# Patient Record
Sex: Female | Born: 1975
Health system: Southern US, Community
[De-identification: ages and names within clinical notes are randomized; demographics above are authoritative.]

## PROBLEM LIST (undated history)

## (undated) DIAGNOSIS — M419 Scoliosis, unspecified: Secondary | ICD-10-CM

## (undated) DIAGNOSIS — K219 Gastro-esophageal reflux disease without esophagitis: Secondary | ICD-10-CM

## (undated) HISTORY — PX: APPENDECTOMY: SHX54

---

## 2019-02-10 ENCOUNTER — Other Ambulatory Visit: Payer: Self-pay | Admitting: Obstetrics and Gynecology

## 2019-04-08 NOTE — H&P (Signed)
Mackenzie Delacruz is a 43 y.o. 43 YO female, P: 1-0-2-1 presents for tubal sterilization and endometrial ablation due to her desire to cease childbearing and dysfunctional uterine bleeding. The patient typically would have a menstrual flow that lasted for 5 days with a pad change 3 times a day. with cramping that was relieved with Tylenol.  At the end of July, however , she began to bleed daily for 1.5 months with the need to change a pad twice daily and minimal cramping. A pelvic ultrasound  01/04/2019 showed a  retroverted uterus with appearance of adenomyosis (measures 8.8 cm from fundus to external os) 4.12 x 5.32 x 5.39 cm; endometrium: 11.86 mm; right ovary-3.16 cm and left ovary-2.70 cm.  Subsequently she was given a 10 day course of Provera 10 mg and her bleeding stopped.  She denies any post coital bleeding, dyspareunia, vaginitis symptoms or changes in bowel or bladder function.  An endometrial biopsy in August 2020 returned benign secretory endometrium with no malignancy or hyperplasia.  A review of both medical and surgical options for management of contraception and dysfunctional uterine bleeding were given to the patient however,  she has decided to proceed with surgical management.   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  Allergies: Sulfa-Hives  Denies sensitivity to peanuts, shellfish, soy, latex or  adhesives.    Medications: Multivitamins daily Nexium (OTC) prn  ROS: Admits to glasses, occasional left knee pain, problems swallowing with acid reflux flare and occasional constipation.   Denies headache, vision changes, nasal congestion,  tinnitus, dizziness, hoarseness, cough,  chest pain, shortness of breath, nausea, vomiting, diarrhea,  urinary frequency, urgency  dysuria, hematuria, vaginitis symptoms, pelvic pain, swelling of joints,easy bruising,  myalgias,  skin rashes, unexplained weight loss and except as is mentioned in the history of present illness, patient's review of systems is otherwise negative.     Physical Exam  Bp: 92/60  P: 74 bpm  R: 16  Temperature: 97.7 degrees F (temporally)     Weight: 111 lbs.  Height: 5 '  BMI: 21.7  Neck: supple without masses or thyromegaly Lungs: clear to auscultation Heart: regular rate and rhythm Abdomen: soft, non-tender and no organomegaly Pelvic:EGBUS- wnl; vagina-normal rugae; uterus-normal size, cervix without lesions or motion tenderness; adnexae-no tenderness or masses Extremities:  no clubbing, cyanosis or edema   Assesment: Dysfunctional Uterine Bleeding                                                     Desire for Sterilization                      Adenomyosis  Disposition:  A discussion was held with patient regarding the indication for her procedure(s) along with the risks, which include but are not limited to: reaction  to anesthesia, damage to adjacent organs, infection,  excessive bleeding and continued menstrual flow. .  The patient verbalized understanding of these risks and has consented to proceed with Tubal Sterilization and Hydrothermal Endometrial Ablation at [facility] on April 14, 2019 @ 1: 30 p.m.   CSN# [id number]   Layza Summa J. Lowell Guitar, PA-C  for Dr. Pierre Bali. Dillard

## 2019-04-10 ENCOUNTER — Other Ambulatory Visit (HOSPITAL_COMMUNITY)
Admission: RE | Admit: 2019-04-10 | Discharge: 2019-04-10 | Disposition: A | Discharge status: Home / Self Care | Payer: BC Managed Care – PPO | Source: Ambulatory Visit | Attending: Obstetrics and Gynecology | Admitting: Obstetrics and Gynecology

## 2019-04-10 DIAGNOSIS — Z20828 Contact with and (suspected) exposure to other viral communicable diseases: Secondary | ICD-10-CM | POA: Diagnosis not present

## 2019-04-10 DIAGNOSIS — Z01812 Encounter for preprocedural laboratory examination: Secondary | ICD-10-CM | POA: Diagnosis present

## 2019-04-11 LAB — NOVEL CORONAVIRUS, NAA (HOSP ORDER, SEND-OUT TO REF LAB; TAT 18-24 HRS): SARS-CoV-2, NAA: NOT DETECTED

## 2019-04-13 ENCOUNTER — Other Ambulatory Visit: Payer: Self-pay

## 2019-04-13 ENCOUNTER — Encounter (HOSPITAL_BASED_OUTPATIENT_CLINIC_OR_DEPARTMENT_OTHER): Payer: Self-pay | Admitting: *Deleted

## 2019-04-13 NOTE — Progress Notes (Signed)
Spoke with patient via telephone for pre op interview. NPO after MN. Patient to take Nexium with a sip of water AM of surgery. Will need UPT, CBC AM of surgery. arrival time 1130.

## 2019-04-14 ENCOUNTER — Ambulatory Visit (HOSPITAL_BASED_OUTPATIENT_CLINIC_OR_DEPARTMENT_OTHER): Payer: BC Managed Care – PPO | Admitting: Anesthesiology

## 2019-04-14 ENCOUNTER — Ambulatory Visit (HOSPITAL_BASED_OUTPATIENT_CLINIC_OR_DEPARTMENT_OTHER)
Admission: RE | Admit: 2019-04-14 | Discharge: 2019-04-14 | Disposition: A | Discharge status: Home / Self Care | Payer: BC Managed Care – PPO | Attending: Obstetrics and Gynecology | Admitting: Obstetrics and Gynecology

## 2019-04-14 ENCOUNTER — Encounter (HOSPITAL_BASED_OUTPATIENT_CLINIC_OR_DEPARTMENT_OTHER)
Admission: RE | Disposition: A | Discharge status: Home / Self Care | Payer: Self-pay | Source: Home / Self Care | Attending: Obstetrics and Gynecology

## 2019-04-14 ENCOUNTER — Encounter (HOSPITAL_BASED_OUTPATIENT_CLINIC_OR_DEPARTMENT_OTHER): Payer: Self-pay | Admitting: *Deleted

## 2019-04-14 DIAGNOSIS — Z882 Allergy status to sulfonamides status: Secondary | ICD-10-CM | POA: Insufficient documentation

## 2019-04-14 DIAGNOSIS — Z87891 Personal history of nicotine dependence: Secondary | ICD-10-CM | POA: Insufficient documentation

## 2019-04-14 DIAGNOSIS — N939 Abnormal uterine and vaginal bleeding, unspecified: Secondary | ICD-10-CM | POA: Insufficient documentation

## 2019-04-14 DIAGNOSIS — Z833 Family history of diabetes mellitus: Secondary | ICD-10-CM | POA: Insufficient documentation

## 2019-04-14 DIAGNOSIS — N920 Excessive and frequent menstruation with regular cycle: Secondary | ICD-10-CM

## 2019-04-14 DIAGNOSIS — Z823 Family history of stroke: Secondary | ICD-10-CM | POA: Diagnosis not present

## 2019-04-14 DIAGNOSIS — K219 Gastro-esophageal reflux disease without esophagitis: Secondary | ICD-10-CM | POA: Diagnosis not present

## 2019-04-14 DIAGNOSIS — Z803 Family history of malignant neoplasm of breast: Secondary | ICD-10-CM | POA: Insufficient documentation

## 2019-04-14 DIAGNOSIS — Z8249 Family history of ischemic heart disease and other diseases of the circulatory system: Secondary | ICD-10-CM | POA: Diagnosis not present

## 2019-04-14 DIAGNOSIS — Z8049 Family history of malignant neoplasm of other genital organs: Secondary | ICD-10-CM | POA: Diagnosis not present

## 2019-04-14 DIAGNOSIS — Z302 Encounter for sterilization: Secondary | ICD-10-CM | POA: Diagnosis not present

## 2019-04-14 DIAGNOSIS — Z79899 Other long term (current) drug therapy: Secondary | ICD-10-CM | POA: Insufficient documentation

## 2019-04-14 HISTORY — PX: HYSTEROSCOPY: SHX211

## 2019-04-14 HISTORY — PX: LAPAROSCOPIC BILATERAL SALPINGECTOMY: SHX5889

## 2019-04-14 HISTORY — DX: Gastro-esophageal reflux disease without esophagitis: K21.9

## 2019-04-14 LAB — CBC
HCT: 38.7 % (ref 36.0–46.0)
Hemoglobin: 13 g/dL (ref 12.0–15.0)
MCH: 30.4 pg (ref 26.0–34.0)
MCHC: 33.6 g/dL (ref 30.0–36.0)
MCV: 90.4 fL (ref 80.0–100.0)
Platelets: 202 10*3/uL (ref 150–400)
RBC: 4.28 MIL/uL (ref 3.87–5.11)
RDW: 12.5 % (ref 11.5–15.5)
WBC: 6.6 10*3/uL (ref 4.0–10.5)
nRBC: 0 % (ref 0.0–0.2)

## 2019-04-14 LAB — POCT PREGNANCY, URINE: Preg Test, Ur: NEGATIVE

## 2019-04-14 SURGERY — SALPINGECTOMY, BILATERAL, LAPAROSCOPIC
Anesthesia: General | Site: Vagina

## 2019-04-14 MED ORDER — HYDROCODONE-ACETAMINOPHEN 5-325 MG PO TABS: ORAL_TABLET | ORAL | 0 refills | Status: DC

## 2019-04-14 MED ORDER — IBUPROFEN 600 MG PO TABS: ORAL_TABLET | ORAL | 1 refills | Status: DC

## 2019-04-14 MED ORDER — ONDANSETRON HCL 4 MG/2ML IJ SOLN
INTRAMUSCULAR | Status: AC
  Filled 2019-04-14: qty 2

## 2019-04-14 MED ORDER — LIDOCAINE HCL 2 % IJ SOLN
INTRAMUSCULAR | Status: DC | PRN
  Administered 2019-04-14: 10 mL

## 2019-04-14 MED ORDER — FENTANYL CITRATE (PF) 100 MCG/2ML IJ SOLN
INTRAMUSCULAR | Status: AC
  Filled 2019-04-14: qty 2

## 2019-04-14 MED ORDER — KETOROLAC TROMETHAMINE 30 MG/ML IJ SOLN
INTRAMUSCULAR | Status: DC | PRN
  Administered 2019-04-14: 30 mg via INTRAVENOUS

## 2019-04-14 MED ORDER — ROCURONIUM BROMIDE 50 MG/5ML IV SOSY
PREFILLED_SYRINGE | INTRAVENOUS | Status: DC | PRN
  Administered 2019-04-14: 30 mg via INTRAVENOUS
  Administered 2019-04-14: 10 mg via INTRAVENOUS

## 2019-04-14 MED ORDER — FAMOTIDINE 20 MG PO TABS
20.0000 mg | ORAL_TABLET | Freq: Two times a day (BID) | ORAL | Status: DC
  Administered 2019-04-14: 20 mg via ORAL
  Filled 2019-04-14: qty 1

## 2019-04-14 MED ORDER — MIDAZOLAM HCL 2 MG/2ML IJ SOLN
INTRAMUSCULAR | Status: AC
  Filled 2019-04-14: qty 2

## 2019-04-14 MED ORDER — ACETAMINOPHEN 160 MG/5ML PO SOLN
325.0000 mg | ORAL | Status: DC | PRN
  Filled 2019-04-14: qty 20.3

## 2019-04-14 MED ORDER — ONDANSETRON HCL 4 MG/2ML IJ SOLN
INTRAMUSCULAR | Status: DC | PRN
  Administered 2019-04-14: 4 mg via INTRAVENOUS

## 2019-04-14 MED ORDER — FENTANYL CITRATE (PF) 100 MCG/2ML IJ SOLN
25.0000 ug | INTRAMUSCULAR | Status: DC | PRN
  Filled 2019-04-14: qty 1

## 2019-04-14 MED ORDER — OXYCODONE HCL 5 MG PO TABS
5.0000 mg | ORAL_TABLET | Freq: Once | ORAL | Status: DC | PRN
  Filled 2019-04-14: qty 1

## 2019-04-14 MED ORDER — LIDOCAINE 2% (20 MG/ML) 5 ML SYRINGE
INTRAMUSCULAR | Status: AC
  Filled 2019-04-14: qty 5

## 2019-04-14 MED ORDER — MEPERIDINE HCL 25 MG/ML IJ SOLN
6.2500 mg | INTRAMUSCULAR | Status: DC | PRN
  Administered 2019-04-14: 6.25 mg via INTRAVENOUS
  Filled 2019-04-14: qty 1

## 2019-04-14 MED ORDER — KETOROLAC TROMETHAMINE 30 MG/ML IJ SOLN
INTRAMUSCULAR | Status: AC
  Filled 2019-04-14: qty 1

## 2019-04-14 MED ORDER — OXYCODONE HCL 5 MG/5ML PO SOLN
5.0000 mg | Freq: Once | ORAL | Status: DC | PRN
  Filled 2019-04-14: qty 5

## 2019-04-14 MED ORDER — PROPOFOL 10 MG/ML IV BOLUS
INTRAVENOUS | Status: DC | PRN
  Administered 2019-04-14: 150 mg via INTRAVENOUS

## 2019-04-14 MED ORDER — FAMOTIDINE 20 MG PO TABS
ORAL_TABLET | ORAL | Status: AC
  Filled 2019-04-14: qty 1

## 2019-04-14 MED ORDER — LACTATED RINGERS IV SOLN
INTRAVENOUS | Status: DC
  Administered 2019-04-14 (×2): via INTRAVENOUS
  Filled 2019-04-14: qty 1000

## 2019-04-14 MED ORDER — FENTANYL CITRATE (PF) 100 MCG/2ML IJ SOLN
INTRAMUSCULAR | Status: DC | PRN
  Administered 2019-04-14 (×2): 50 ug via INTRAVENOUS

## 2019-04-14 MED ORDER — ACETAMINOPHEN 325 MG PO TABS
325.0000 mg | ORAL_TABLET | ORAL | Status: DC | PRN
  Filled 2019-04-14: qty 2

## 2019-04-14 MED ORDER — BUPIVACAINE HCL (PF) 0.25 % IJ SOLN
INTRAMUSCULAR | Status: DC | PRN
  Administered 2019-04-14: 10 mL

## 2019-04-14 MED ORDER — SILVER NITRATE-POT NITRATE 75-25 % EX MISC
CUTANEOUS | Status: DC | PRN
  Administered 2019-04-14: 1

## 2019-04-14 MED ORDER — ROCURONIUM BROMIDE 10 MG/ML (PF) SYRINGE
PREFILLED_SYRINGE | INTRAVENOUS | Status: AC
  Filled 2019-04-14: qty 10

## 2019-04-14 MED ORDER — DEXAMETHASONE SODIUM PHOSPHATE 10 MG/ML IJ SOLN
INTRAMUSCULAR | Status: AC
  Filled 2019-04-14: qty 1

## 2019-04-14 MED ORDER — DEXAMETHASONE SODIUM PHOSPHATE 10 MG/ML IJ SOLN
INTRAMUSCULAR | Status: DC | PRN
  Administered 2019-04-14: 10 mg via INTRAVENOUS

## 2019-04-14 MED ORDER — SUGAMMADEX SODIUM 200 MG/2ML IV SOLN
INTRAVENOUS | Status: DC | PRN
  Administered 2019-04-14: 110 mg via INTRAVENOUS

## 2019-04-14 MED ORDER — ONDANSETRON HCL 4 MG/2ML IJ SOLN
4.0000 mg | Freq: Once | INTRAMUSCULAR | Status: AC | PRN
  Administered 2019-04-14: 4 mg via INTRAVENOUS
  Filled 2019-04-14: qty 2

## 2019-04-14 MED ORDER — LIDOCAINE 2% (20 MG/ML) 5 ML SYRINGE
INTRAMUSCULAR | Status: DC | PRN
  Administered 2019-04-14: 100 mg via INTRAVENOUS

## 2019-04-14 MED ORDER — SODIUM CHLORIDE 0.9 % IR SOLN
Status: DC | PRN
  Administered 2019-04-14: 3000 mL

## 2019-04-14 MED ORDER — MEPERIDINE HCL 25 MG/ML IJ SOLN
INTRAMUSCULAR | Status: AC
  Filled 2019-04-14: qty 1

## 2019-04-14 MED ORDER — PROPOFOL 10 MG/ML IV BOLUS
INTRAVENOUS | Status: AC
  Filled 2019-04-14: qty 40

## 2019-04-14 MED ORDER — MIDAZOLAM HCL 5 MG/5ML IJ SOLN
INTRAMUSCULAR | Status: DC | PRN
  Administered 2019-04-14: 1 mg via INTRAVENOUS

## 2019-04-14 SURGICAL SUPPLY — 46 items
BARRIER ADHS 3X4 INTERCEED (GAUZE/BANDAGES/DRESSINGS) IMPLANT
CABLE HIGH FREQUENCY MONO STRZ (ELECTRODE) IMPLANT
CANISTER SUCT 3000ML PPV (MISCELLANEOUS) ×4 IMPLANT
CATH ROBINSON RED A/P 16FR (CATHETERS) ×4 IMPLANT
COVER WAND RF STERILE (DRAPES) ×4 IMPLANT
DERMABOND ADVANCED (GAUZE/BANDAGES/DRESSINGS) ×2
DERMABOND ADVANCED .7 DNX12 (GAUZE/BANDAGES/DRESSINGS) ×2 IMPLANT
DILATOR CANAL MILEX (MISCELLANEOUS) IMPLANT
DRSG OPSITE POSTOP 3X4 (GAUZE/BANDAGES/DRESSINGS) IMPLANT
DRSG VASELINE 3X18 (GAUZE/BANDAGES/DRESSINGS) IMPLANT
DURAPREP 26ML APPLICATOR (WOUND CARE) ×4 IMPLANT
FORCEPS CUTTING 33CM 5MM (CUTTING FORCEPS) ×4 IMPLANT
FORCEPS CUTTING 45CM 5MM (CUTTING FORCEPS) IMPLANT
GAUZE 4X4 16PLY RFD (DISPOSABLE) ×4 IMPLANT
GLOVE BIO SURGEON STRL SZ 6.5 (GLOVE) ×3 IMPLANT
GLOVE BIO SURGEONS STRL SZ 6.5 (GLOVE) ×1
GLOVE BIOGEL PI IND STRL 7.0 (GLOVE) ×6 IMPLANT
GLOVE BIOGEL PI INDICATOR 7.0 (GLOVE) ×6
GOWN STRL REUS W/ TWL LRG LVL3 (GOWN DISPOSABLE) ×4 IMPLANT
GOWN STRL REUS W/TWL LRG LVL3 (GOWN DISPOSABLE) ×12 IMPLANT
MANIPULATOR UTERINE 4.5 ZUMI (MISCELLANEOUS) IMPLANT
NEEDLE INSUFFLATION 120MM (ENDOMECHANICALS) ×4 IMPLANT
NS IRRIG 1000ML POUR BTL (IV SOLUTION) ×4 IMPLANT
PACK LAPAROSCOPY BASIN (CUSTOM PROCEDURE TRAY) ×4 IMPLANT
PACK TRENDGUARD 450 HYBRID PRO (MISCELLANEOUS) IMPLANT
PACK VAGINAL MINOR WOMEN LF (CUSTOM PROCEDURE TRAY) ×4 IMPLANT
PAD OB MATERNITY 4.3X12.25 (PERSONAL CARE ITEMS) ×4 IMPLANT
POUCH SPECIMEN RETRIEVAL 10MM (ENDOMECHANICALS) IMPLANT
PROTECTOR NERVE ULNAR (MISCELLANEOUS) ×8 IMPLANT
SET GENESYS HTA PROCERVA (MISCELLANEOUS) ×4 IMPLANT
SET IRRIG TUBING LAPAROSCOPIC (IRRIGATION / IRRIGATOR) IMPLANT
SET SUCTION IRRIG HYDROSURG (IRRIGATION / IRRIGATOR) ×4 IMPLANT
SET TUBE SMOKE EVAC HIGH FLOW (TUBING) ×4 IMPLANT
SHEARS HARMONIC ACE PLUS 36CM (ENDOMECHANICALS) IMPLANT
SOLUTION ELECTROLUBE (MISCELLANEOUS) IMPLANT
SUT MNCRL AB 3-0 PS2 27 (SUTURE) ×4 IMPLANT
SUT MNCRL AB 4-0 PS2 18 (SUTURE) ×4 IMPLANT
SUT VICRYL 0 ENDOLOOP (SUTURE) IMPLANT
SUT VICRYL 0 UR6 27IN ABS (SUTURE) ×4 IMPLANT
TOWEL OR 17X26 10 PK STRL BLUE (TOWEL DISPOSABLE) ×4 IMPLANT
TRAY FOLEY W/BAG SLVR 14FR (SET/KITS/TRAYS/PACK) ×4 IMPLANT
TRENDGUARD 450 HYBRID PRO PACK (MISCELLANEOUS)
TROCAR BALLN 12MMX100 BLUNT (TROCAR) ×4 IMPLANT
TROCAR XCEL NON-BLD 5MMX100MML (ENDOMECHANICALS) IMPLANT
UNDERPAD 30X36 HEAVY ABSORB (UNDERPADS AND DIAPERS) ×4 IMPLANT
WARMER LAPAROSCOPE (MISCELLANEOUS) ×4 IMPLANT

## 2019-04-14 NOTE — Transfer of Care (Signed)
Immediate Anesthesia Transfer of Care Note  Patient: Mackenzie Delacruz  Procedure(s) Performed: LAPAROSCOPIC BILATERAL SALPINGECTOMY (Bilateral Abdomen) HYSTEROSCOPY WITH HYDROTHERMAL ABLATION (N/A Vagina )  Patient Location: PACU  Anesthesia Type:General  Level of Consciousness: sedated  Airway & Oxygen Therapy: Patient Spontanous Breathing and Patient connected to nasal cannula oxygen  Post-op Assessment: Report given to RN  Post vital signs: Reviewed and stable  Last Vitals: 115/80, 76, 15, 100%, 97.7 Vitals Value Taken Time  BP    Temp    Pulse    Resp 24 04/14/19 1515  SpO2    Vitals shown include unvalidated device data.  Last Pain:  Vitals:   04/14/19 1239  TempSrc: Oral         Complications: No apparent anesthesia complications

## 2019-04-14 NOTE — Op Note (Signed)
Preop Diagnosis: Abnormal Uterine Bleeding with desire for surgical sterilization   Postop Diagnosis: Abnormal Uterine Bleeding with desire for surgical sterilization   Procedure: LAPAROSCOPIC BILATERAL SALPINGECTOMY HYSTEROSCOPY WITH HYDROTHERMAL ABLATION   Anesthesia: General   Anesthesiologist: Janeece Riggers, MD   Attending: Crawford Givens, MD   Assistant:Elmira Florene Glen Findings: normal apearing endometrium.  Both ostia visualized.  Uterus is retroverted.  Normal appearing tubes and ovaries.  Normal appearing uterus.  A small amount of fluid was seen in the culdesac at L/S.  Some small adhesions in the right upper quadrant where the appendectomy took place.  Norma apperaing liver and abdominal anatomy  Pathology: none  Fluids:500cc  UOP: 400 cc clear  EBL: minimal   Complications: none  Procedure: HTA , Laparoscopy and Bilaterial salpingectomy The patient was taken to the operating room after risks benefits and alternatives were discussed with patient, the patient verbalized understanding and consent signed and witnessed. The patient was placed under general anesthesia and prepped and draped in normal sterile fashion. A bivalve speculum was placed in the patient's vagina and the anterior lip of the cervix was grasped with a single-tooth tenaculum. The cervix was patulent and   passage of the hysteroscope occurred without dilation.   .  The hysteroscope was introduced into the uterine cavity with findings as noted above.  The hysteroscope was reintroduced and hydrothermal ablation was performed without difficulty. Because the cervix was patulent and I had to place a single tooth tenaculum on the pts left side of posterior and anterior lip to close the OS.  The ablation stopped at 9 min bc a third of the fluid was lost.   The tenaculum was then anchored to the acorn and bivalve speculum were removed. Attention was given to the abdomen.   A 2 cm umbilical incision was then performed.and  carried down to the fascia.  The fascia was then opened and extended bilaterally.  Peritoneum was then entered.  o vicryl was then placed around the fascia in a circumferential fashion.   The hasson was placed and ancored to the suture.. Normal pelvic anatomy was noted.   Uterus had a 2 cm fundal fibroid.  The tubes, ovaries and appendix apperared normal.   The anterior and  Posterior culdesac and liver appeared normal.     Two 45mm trocars were placed in the right and left lower quadrants of the abdomen under direct visualization of the laparoscope.  The tubes were removed and sent to pathology using the gyrus.  Hemostasis was noted.  Air was allowed to leave the abdomen.  The abdomen was reinsufflated and hemostasis was still noted.     Following the procedure the umbilical hasson was removed after intra-abdominal carbon dioxide was expressed. The fascia was reaproximated by tying the 0 vicryl suture.   The 57mm skin incision was closed with dermabond.  The 10 mm incision was closed with a  subcuticular suture of 3-0 monocryl. The intrauterine manipulator was then removed.  The tenaculum site was oozing and made henmostatic with silver nitrate and pressure.   Instrument, sponge, and needle counts were correct prior to abdominal closure and at the conclusion of the case.  Findings:          Complications:  None; patient tolerated the procedure well.         Disposition: PACU - hemodynamically stable.         Condition: stable

## 2019-04-14 NOTE — Discharge Instructions (Signed)
Post Anesthesia Home Care Instructions  Activity: Get plenty of rest for the remainder of the day. A responsible adult should stay with you for 24 hours following the procedure.  For the next 24 hours, DO NOT: -Drive a car -Paediatric nurse -Drink alcoholic beverages -Take any medication unless instructed by your physician -Make any legal decisions or sign important papers.  Meals: Start with liquid foods such as gelatin or soup. Progress to regular foods as tolerated. Avoid greasy, spicy, heavy foods. If nausea and/or vomiting occur, drink only clear liquids until the nausea and/or vomiting subsides. Call your physician if vomiting continues.  Special Instructions/Symptoms: Your throat may feel dry or sore from the anesthesia or the breathing tube placed in your throat during surgery. If this causes discomfort, gargle with warm salt water. The discomfort should disappear within 24 hours.  If you had a scopolamine patch placed behind your ear for the management of post- operative nausea and/or vomiting:  1. The medication in the patch is effective for 72 hours, after which it should be removed.  Wrap patch in a tissue and discard in the trash. Wash hands thoroughly with soap and water. 2. You may remove the patch earlier than 72 hours if you experience unpleasant side effects which may include dry mouth, dizziness or visual disturbances. 3. Avoid touching the patch. Wash your hands with soap and water after contact with the patch.   DISCHARGE INSTRUCTIONS: Laparoscopy  The following instructions have been prepared to help you care for yourself upon your return home today.  Wound care:  Do not get the incision wet for the first 24 hours. The incision should be kept clean and dry.  Should the incision become sore, red, and swollen after the first week, check with your doctor.  Personal hygiene:  Shower the day after your procedure.  Activity and limitations:  Do NOT drive or  operate any equipment today.  Do NOT lift anything more than 15 pounds for 2-3 weeks after surgery.  Do NOT rest in bed all day.  Walking is encouraged. Walk each day, starting slowly with 5-minute walks 3 or 4 times a day. Slowly increase the length of your walks.  Walk up and down stairs slowly.  Do NOT do strenuous activities, such as golfing, playing tennis, bowling, running, biking, weight lifting, gardening, mowing, or vacuuming for 2-4 weeks. Ask your doctor when it is okay to start.  Diet: Eat a light meal as desired this evening. You may resume your usual diet tomorrow.  Return to work: This is dependent on the type of work you do. For the most part you can return to a desk job within a week of surgery. If you are more active at work, please discuss this with your doctor.  What to expect after your surgery: You may have a slight burning sensation when you urinate on the first day. You may have a very small amount of blood in the urine. Expect to have a small amount of vaginal discharge/light bleeding for 1-2 weeks. It is not unusual to have abdominal soreness and bruising for up to 2 weeks. You may be tired and need more rest for about 1 week. You may experience shoulder pain for 24-72 hours. Lying flat in bed may relieve it.  Call your doctor for any of the following:  Develop a fever of 100.4 or greater  Inability to urinate 6 hours after discharge from hospital  Severe pain not relieved by pain medications  Persistent  of heavy bleeding at incision site  Redness or swelling around incision site after a week  Increasing nausea or vomiting  Call [facility] @ [phone] if:  You have a temperature greater than or equal to 100.4 degrees Farenheit orally You have pain that is not made better by the pain medication given and taken as directed You have excessive bleeding or problems urinating  Take Colace (Docusate Sodium/Stool Softener) 100 mg 2-3 times daily  while taking narcotic pain medicine to avoid constipation or until bowel movements are regular. Take Ibuprofen 600 mg with food every 6 hours for 3 days then as needed for pain  (first dose 8 pm on the day of surgery)  You may drive after 24 hours You may walk up steps  You may shower tomorrow You may resume a regular diet  Keep incisions clean and dry Do not lift over 15 pounds for 6 weeks  Avoid anything in vagina until after your post-operative visit

## 2019-04-14 NOTE — Anesthesia Preprocedure Evaluation (Signed)
Anesthesia Evaluation  Patient identified by MRN, date of birth, ID band Patient awake    Reviewed: Allergy & Precautions, H&P , NPO status , Patient's Chart, lab work & pertinent test results, reviewed documented beta blocker date and time   Airway Mallampati: II  TM Distance: >3 FB Neck ROM: full    Dental no notable dental hx.    Pulmonary neg pulmonary ROS,    Pulmonary exam normal breath sounds clear to auscultation       Cardiovascular Exercise Tolerance: Good negative cardio ROS   Rhythm:regular Rate:Normal     Neuro/Psych negative neurological ROS  negative psych ROS   GI/Hepatic Neg liver ROS, GERD  Medicated,  Endo/Other  negative endocrine ROS  Renal/GU negative Renal ROS  negative genitourinary   Musculoskeletal   Abdominal   Peds  Hematology negative hematology ROS (+)   Anesthesia Other Findings   Reproductive/Obstetrics negative OB ROS                            Anesthesia Physical Anesthesia Plan  ASA: II  Anesthesia Plan: General   Post-op Pain Management:    Induction: Intravenous  PONV Risk Score and Plan: 3 and Ondansetron, Dexamethasone, Treatment may vary due to age or medical condition and Midazolam  Airway Management Planned: Oral ETT  Additional Equipment:   Intra-op Plan:   Post-operative Plan: Extubation in OR  Informed Consent: I have reviewed the patients History and Physical, chart, labs and discussed the procedure including the risks, benefits and alternatives for the proposed anesthesia with the patient or authorized representative who has indicated his/her understanding and acceptance.     Dental Advisory Given  Plan Discussed with: CRNA, Anesthesiologist and Surgeon  Anesthesia Plan Comments: (  )       Anesthesia Quick Evaluation

## 2019-04-14 NOTE — Anesthesia Procedure Notes (Signed)
Procedure Name: Intubation Date/Time: 04/14/2019 1:32 PM Performed by: Bonney Aid, CRNA Pre-anesthesia Checklist: Patient identified, Emergency Drugs available, Suction available and Patient being monitored Patient Re-evaluated:Patient Re-evaluated prior to induction Oxygen Delivery Method: Circle system utilized Preoxygenation: Pre-oxygenation with 100% oxygen Induction Type: IV induction Ventilation: Mask ventilation without difficulty Laryngoscope Size: Mac and 3 Tube type: Oral Tube size: 7.0 mm Number of attempts: 1 Airway Equipment and Method: Stylet and Oral airway Placement Confirmation: ETT inserted through vocal cords under direct vision,  positive ETCO2 and breath sounds checked- equal and bilateral Secured at: 19 cm Tube secured with: Tape Dental Injury: Teeth and Oropharynx as per pre-operative assessment

## 2019-04-15 ENCOUNTER — Encounter (HOSPITAL_BASED_OUTPATIENT_CLINIC_OR_DEPARTMENT_OTHER): Payer: Self-pay | Admitting: Obstetrics and Gynecology

## 2019-04-15 LAB — SURGICAL PATHOLOGY

## 2019-04-15 NOTE — Anesthesia Postprocedure Evaluation (Signed)
Anesthesia Post Note  Patient: Mackenzie Delacruz  Procedure(s) Performed: LAPAROSCOPIC BILATERAL SALPINGECTOMY (Bilateral Abdomen) HYSTEROSCOPY WITH HYDROTHERMAL ABLATION (N/A Vagina )     Patient location during evaluation: PACU Anesthesia Type: General Level of consciousness: awake and alert Pain management: pain level controlled Vital Signs Assessment: post-procedure vital signs reviewed and stable Respiratory status: spontaneous breathing, nonlabored ventilation, respiratory function stable and patient connected to nasal cannula oxygen Cardiovascular status: blood pressure returned to baseline and stable Postop Assessment: no apparent nausea or vomiting Anesthetic complications: no    Last Vitals:  Vitals:   04/14/19 1630 04/14/19 1730  BP:  105/69  Pulse: 71 75  Resp: 18 14  Temp:  36.8 C  SpO2: 97% 97%    Last Pain:  Vitals:   04/15/19 1058  TempSrc:   PainSc: 3                  Woodie Degraffenreid

## 2019-09-14 ENCOUNTER — Emergency Department (HOSPITAL_COMMUNITY)
Admission: EM | Admit: 2019-09-14 | Discharge: 2019-09-15 | Disposition: A | Discharge status: Home / Self Care | Payer: BC Managed Care – PPO | Attending: Emergency Medicine | Admitting: Emergency Medicine

## 2019-09-14 ENCOUNTER — Encounter (HOSPITAL_COMMUNITY): Payer: Self-pay

## 2019-09-14 ENCOUNTER — Emergency Department (HOSPITAL_COMMUNITY): Payer: BC Managed Care – PPO

## 2019-09-14 ENCOUNTER — Other Ambulatory Visit: Payer: Self-pay

## 2019-09-14 DIAGNOSIS — Z87891 Personal history of nicotine dependence: Secondary | ICD-10-CM | POA: Diagnosis not present

## 2019-09-14 DIAGNOSIS — Z79899 Other long term (current) drug therapy: Secondary | ICD-10-CM | POA: Insufficient documentation

## 2019-09-14 DIAGNOSIS — K219 Gastro-esophageal reflux disease without esophagitis: Secondary | ICD-10-CM | POA: Diagnosis not present

## 2019-09-14 DIAGNOSIS — R0789 Other chest pain: Secondary | ICD-10-CM | POA: Insufficient documentation

## 2019-09-14 DIAGNOSIS — R079 Chest pain, unspecified: Secondary | ICD-10-CM | POA: Diagnosis present

## 2019-09-14 LAB — BASIC METABOLIC PANEL
Anion gap: 11 (ref 5–15)
BUN: 14 mg/dL (ref 6–20)
CO2: 25 mmol/L (ref 22–32)
Calcium: 9.4 mg/dL (ref 8.9–10.3)
Chloride: 99 mmol/L (ref 98–111)
Creatinine, Ser: 0.79 mg/dL (ref 0.44–1.00)
GFR calc Af Amer: >60 mL/min (ref >60)
GFR calc non Af Amer: >60 mL/min (ref >60)
Glucose, Bld: 100 mg/dL — ABNORMAL HIGH (ref 70–99)
Potassium: 4 mmol/L (ref 3.5–5.1)
Sodium: 135 mmol/L (ref 135–145)

## 2019-09-14 LAB — CBC
HCT: 40.3 % (ref 36.0–46.0)
Hemoglobin: 13.4 g/dL (ref 12.0–15.0)
MCH: 30.1 pg (ref 26.0–34.0)
MCHC: 33.3 g/dL (ref 30.0–36.0)
MCV: 90.6 fL (ref 80.0–100.0)
Platelets: 220 10*3/uL (ref 150–400)
RBC: 4.45 MIL/uL (ref 3.87–5.11)
RDW: 12.3 % (ref 11.5–15.5)
WBC: 7.6 10*3/uL (ref 4.0–10.5)
nRBC: 0 % (ref 0.0–0.2)

## 2019-09-14 LAB — TROPONIN I (HIGH SENSITIVITY)
Troponin I (High Sensitivity): 4 ng/L (ref <18)
Troponin I (High Sensitivity): 4 ng/L (ref <18)

## 2019-09-14 MED ORDER — ALUM & MAG HYDROXIDE-SIMETH 200-200-20 MG/5ML PO SUSP
30.0000 mL | Freq: Once | ORAL | Status: AC
  Administered 2019-09-14: 30 mL via ORAL
  Filled 2019-09-14: qty 30

## 2019-09-14 MED ORDER — LIDOCAINE VISCOUS HCL 2 % MT SOLN
15.0000 mL | Freq: Once | OROMUCOSAL | Status: AC
  Administered 2019-09-14: 15 mL via ORAL
  Filled 2019-09-14: qty 15

## 2019-09-14 NOTE — ED Triage Notes (Signed)
Pt presents to ED with complaints of chest pain since having covid vaccine Saturday.

## 2019-09-14 NOTE — ED Provider Notes (Signed)
Mackenzie Delacruz   CSN: [id number] Arrival date & time: 09/14/19  2006   History Chief Complaint  Patient presents with   Chest Pain    Mackenzie Delacruz is a 44 y.o. female.  The history is provided by the patient.  Chest Pain She has been having pain across her upper chest since that her left shoulder and around her neck for the last 2 days.  She describes the pain is both dull and heavy.  She does complain of a sore throat, and it seems to be worse when she swallows.  Nothing seems to make it better.  Is not affected by breathing or movement.  She is not able to put a number on the pain but states that she feels its bad enough that it would keep her from sleeping.  She has taken Tums during the day and that has given her temporary relief.  She has a history of acid reflux and had stopped taking her omeprazole and has been using Tums on a as needed basis.  She has had some mild nausea today but has not had diaphoresis.  She denies history of hypertension, diabetes, hyperlipidemia.  She is a non-smoker and there is no family history of premature coronary atherosclerosis.  Of Delacruz, she did receive the [employer] Covid vaccine 3 days ago.  Past Medical History:  Diagnosis Date   GERD (gastroesophageal reflux disease)     There are no problems to display for this patient.   Past Surgical History:  Procedure Laterality Date   APPENDECTOMY     HYSTEROSCOPY N/A 04/14/2019   Procedure: HYSTEROSCOPY WITH HYDROTHERMAL ABLATION;  Surgeon: Jaymes Graff, MD;  Location: [facility];  Service: Gynecology;  Laterality: N/A;   LAPAROSCOPIC BILATERAL SALPINGECTOMY Bilateral 04/14/2019   Procedure: LAPAROSCOPIC BILATERAL SALPINGECTOMY;  Surgeon: Jaymes Graff, MD;  Location: [facility];  Service: Gynecology;  Laterality: Bilateral;     OB History   No obstetric history on file.     No family history on file.  Social  History   Tobacco Use   Smoking status: Former Smoker    Types: Cigarettes    Quit date: 08/01/2004    Years since quitting: 15.1   Smokeless tobacco: Never Used  Substance Use Topics   Alcohol use: Not Currently   Drug use: Never    Home Medications Prior to Admission medications   Medication Sig Start Date End Date Taking? Authorizing Provider  esomeprazole (NEXIUM) 40 MG capsule Take 40 mg by mouth as needed.    [provider]  HYDROcodone-acetaminophen (NORCO/VICODIN) 5-325 MG tablet take 1 tablet po every 6 hours as needed for severe post operative pain 04/14/19   Henreitta Leber, PA-C  ibuprofen (ADVIL) 600 MG tablet take 1 tablet po pc every 6 hours for 5 days (1st dose 8:30 pm today) 04/14/19   Henreitta Leber, PA-C  Multiple Vitamin (MULTIVITAMIN WITH MINERALS) TABS tablet Take 1 tablet by mouth daily.    [provider]    Allergies    Sulfa antibiotics  Review of Systems   Review of Systems  Cardiovascular: Positive for chest pain.  All other systems reviewed and are negative.   Physical Exam Updated Vital Signs BP (!) 141/82 (BP Location: Left Arm)    Pulse 70    Temp 98 F (36.7 C)    Resp 20    LMP 08/29/2019    SpO2 100%   Physical Exam Vitals and  nursing Delacruz reviewed.   44 year old female, resting comfortably and in no acute distress. Vital signs are significant for borderline elevated blood pressure. Oxygen saturation is 100%, which is normal. Head is normocephalic and atraumatic. PERRLA, EOMI. Oropharynx is clear. Neck is nontender and supple without adenopathy or JVD. Back is nontender and there is no CVA tenderness. Lungs are clear without rales, wheezes, or rhonchi. Chest has mild tenderness across the upper chest extending to the left shoulder.  There is no crepitus. Heart has regular rate and rhythm without murmur. Abdomen is soft, flat, nontender without masses or hepatosplenomegaly and peristalsis is normoactive. Extremities  have no cyanosis or edema, full range of motion is present. Skin is warm and dry without rash. Neurologic: Mental status is normal, cranial nerves are intact, there are no motor or sensory deficits.  ED Results / Procedures / Treatments   Labs (all labs ordered are listed, but only abnormal results are displayed) Labs Reviewed  BASIC METABOLIC PANEL - Abnormal; Notable for the following components:      Result Value   Glucose, Bld 100 (*)    All other components within normal limits  D-DIMER, QUANTITATIVE (NOT AT [facility]) - Abnormal; Notable for the following components:   D-Dimer, Quant 0.71 (*)    All other components within normal limits  GROUP A STREP BY PCR  CBC  POC URINE PREG, ED  TROPONIN I (HIGH SENSITIVITY)  TROPONIN I (HIGH SENSITIVITY)    EKG EKG Interpretation  Date/Time:  Tuesday September 14 2019 20:26:33 EDT Ventricular Rate:  72 PR Interval:  126 QRS Duration: 70 QT Interval:  372 QTC Calculation: 407 R Axis:   80 Text Interpretation: Normal sinus rhythm Nonspecific T wave abnormality Abnormal ECG No old tracing to compare Confirmed by Delora Fuel ([id number]) on 09/14/2019 11:11:49 PM   Radiology DG Chest 2 View  Result Date: 09/14/2019 CLINICAL DATA:  Chest pain, history of recent COVID-19 vaccine EXAM: CHEST - 2 VIEW COMPARISON:  None. FINDINGS: The heart size and mediastinal contours are within normal limits. Both lungs are clear. The visualized skeletal structures are unremarkable. IMPRESSION: No active cardiopulmonary disease. Electronically Signed   By: Inez Catalina M.D.   On: 09/14/2019 21:17   CT Angio Chest PE W and/or Wo Contrast  Result Date: 09/15/2019 CLINICAL DATA:  Chest pain since COVID vaccination EXAM: CT ANGIOGRAPHY CHEST WITH CONTRAST TECHNIQUE: Multidetector CT imaging of the chest was performed using the standard protocol during bolus administration of intravenous contrast. Multiplanar CT image reconstructions and MIPs were obtained to evaluate  the vascular anatomy. CONTRAST:  172mL OMNIPAQUE IOHEXOL 350 MG/ML SOLN COMPARISON:  None. FINDINGS: Cardiovascular: Contrast injection is sufficient to demonstrate satisfactory opacification of the pulmonary arteries to the segmental level. There is no pulmonary embolus or evidence of right heart strain. The size of the main pulmonary artery is normal. Heart size is normal, with no pericardial effusion. The course and caliber of the aorta are normal. There is no atherosclerotic calcification. Opacification decreased due to pulmonary arterial phase contrast bolus timing. Mediastinum/Nodes: No mediastinal, hilar or axillary lymphadenopathy. Normal visualized thyroid. Thoracic esophageal course is normal. Lungs/Pleura: Airways are patent. No pleural effusion, lobar consolidation, pneumothorax or pulmonary infarction. Upper Abdomen: Contrast bolus timing is not optimized for evaluation of the abdominal organs. The visualized portions of the organs of the upper abdomen are normal. Musculoskeletal: No chest wall abnormality. No bony spinal canal stenosis. Review of the MIP images confirms the above findings. IMPRESSION: 1. No  pulmonary embolus. 2. No acute thoracic abnormality. Electronically Signed   By: Deatra Robinson M.D.   On: 09/15/2019 01:36    Procedures Procedures   Medications Ordered in ED Medications  pantoprazole (PROTONIX) EC tablet 40 mg (has no administration in time range)  alum & mag hydroxide-simeth (MAALOX/MYLANTA) 200-200-20 MG/5ML suspension 30 mL (30 mLs Oral Given 09/14/19 2323)    And  lidocaine (XYLOCAINE) 2 % viscous mouth solution 15 mL (15 mLs Oral Given 09/14/19 2323)  iohexol (OMNIPAQUE) 350 MG/ML injection 100 mL (100 mLs Intravenous Contrast Given 09/15/19 0104)    ED Course  I have reviewed the triage vital signs and the nursing notes.  Pertinent labs & imaging results that were available during my care of the patient were reviewed by me and considered in my medical decision  making (see chart for details).  Chest pain of uncertain cause.  Onset was about 1 day following COVID-19 vaccination, and this certainly could be a vaccine side effect.  Chest x-ray is normal and troponin is normal with repeat troponin pending.  ECG shows minor nonspecific T wave changes heart pathway score is 2 which puts her at low risk for major adverse cardiac events in the next 6 weeks.  However, with her having received the [employer] vaccine, she is at risk for abnormal clotting and will screen for pulmonary embolism with D-dimer.  No risk factors to suggest aortic dissection.  No pneumonia seen on x-ray.  Old records are reviewed confirming COVID-19 vaccination on April 10.  Strep screen is negative.  Repeat troponin is normal.  D-dimer is elevated, will send for CT angiogram of the chest to rule out pulmonary embolism.  Please Delacruz, this is a condition with potential high morbidity and mortality.  She did get excellent symptomatic relief with GI cocktail.  She relates a history of GERD and had been on omeprazole in the past.  Will need to restart this.  CT angiogram of the chest showed no acute process, specifically, no evidence of pulmonary embolism or occult pneumonia.  She is discharged with prescription for omeprazole and told to use acetaminophen as needed for pain.  Pain in her shoulders is probably a side effect of recent COVID-19 vaccination.  MDM Rules/Calculators/A&P  Final Clinical Impression(s) / ED Diagnoses Final diagnoses:  Atypical chest pain  Gastroesophageal reflux disease, unspecified whether esophagitis present    Rx / DC Orders ED Discharge Orders         Ordered    omeprazole (PRILOSEC) 20 MG capsule  Daily,   Status:  Discontinued     09/15/19 0035    omeprazole (PRILOSEC) 40 MG capsule  Daily     09/15/19 0210           Dione Booze, MD 09/15/19 [phone]

## 2019-09-15 ENCOUNTER — Emergency Department (HOSPITAL_COMMUNITY): Payer: BC Managed Care – PPO

## 2019-09-15 LAB — D-DIMER, QUANTITATIVE: D-Dimer, Quant: 0.71 ug/mL-FEU — ABNORMAL HIGH (ref 0.00–0.50)

## 2019-09-15 LAB — GROUP A STREP BY PCR: Group A Strep by PCR: NOT DETECTED

## 2019-09-15 MED ORDER — PANTOPRAZOLE SODIUM 40 MG PO TBEC
40.0000 mg | DELAYED_RELEASE_TABLET | Freq: Once | ORAL | Status: AC
  Administered 2019-09-15: 40 mg via ORAL
  Filled 2019-09-15: qty 1

## 2019-09-15 MED ORDER — IOHEXOL 350 MG/ML SOLN
100.0000 mL | Freq: Once | INTRAVENOUS | Status: AC | PRN
  Administered 2019-09-15: 100 mL via INTRAVENOUS

## 2019-09-15 MED ORDER — OMEPRAZOLE 40 MG PO CPDR: 40.0000 mg | DELAYED_RELEASE_CAPSULE | Freq: Every day | ORAL | 0 refills | Status: DC

## 2019-09-15 MED ORDER — OMEPRAZOLE 20 MG PO CPDR: 20.0000 mg | DELAYED_RELEASE_CAPSULE | Freq: Every day | ORAL | 0 refills | Status: DC

## 2019-09-15 NOTE — Discharge Instructions (Signed)
Some of your pain may be related to your COVID-19 vaccine.  Take acetaminophen as needed for pain.  Use ice and/or heat as needed.  Return if you have any new symptoms that concern you.

## 2020-04-24 DIAGNOSIS — Z1231 Encounter for screening mammogram for malignant neoplasm of breast: Secondary | ICD-10-CM | POA: Diagnosis not present

## 2020-05-30 DIAGNOSIS — R87612 Low grade squamous intraepithelial lesion on cytologic smear of cervix (LGSIL): Secondary | ICD-10-CM | POA: Diagnosis not present

## 2020-05-30 DIAGNOSIS — Z01419 Encounter for gynecological examination (general) (routine) without abnormal findings: Secondary | ICD-10-CM | POA: Diagnosis not present

## 2020-05-30 DIAGNOSIS — Z3040 Encounter for surveillance of contraceptives, unspecified: Secondary | ICD-10-CM | POA: Diagnosis not present

## 2020-05-30 DIAGNOSIS — Z1231 Encounter for screening mammogram for malignant neoplasm of breast: Secondary | ICD-10-CM | POA: Diagnosis not present

## 2020-07-14 DIAGNOSIS — Z20822 Contact with and (suspected) exposure to covid-19: Secondary | ICD-10-CM | POA: Diagnosis not present

## 2021-01-27 IMAGING — CT CT ANGIO CHEST
2 of 6 series · 18 of 46 positions shown · IV contrast (Omnipaque or Isovue)
Comparison: None.

CLINICAL DATA: Chest pain since COVID vaccination

EXAM:
CT ANGIOGRAPHY CHEST WITH CONTRAST
TECHNIQUE: Multidetector CT imaging of the chest was performed using the
standard protocol during bolus administration of intravenous
contrast. Multiplanar CT image reconstructions and MIPs were
obtained to evaluate the vascular anatomy.
CONTRAST:  100mL OMNIPAQUE IOHEXOL 350 MG/ML SOLN

[Series 5: pe axial thins · axial · 0.52mm/px · z∈[-167,+36]mm · 15 of 223 slices shown]
[im 10/223  lung]
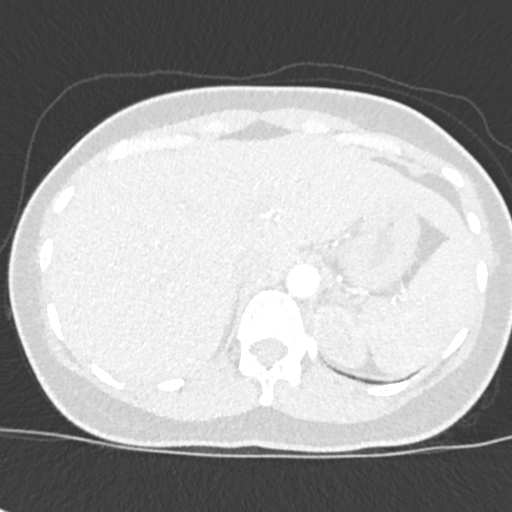
[im 29/223  soft-tissue]
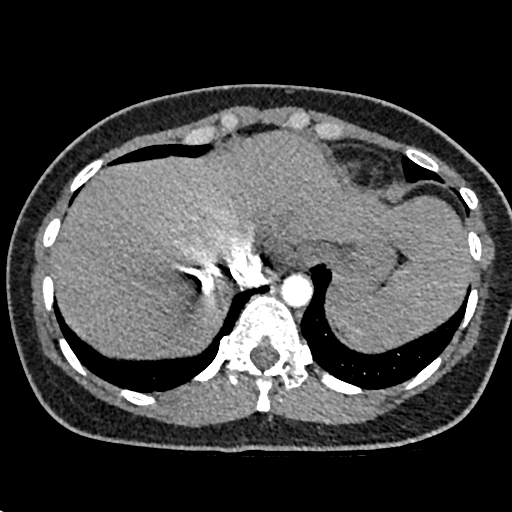
[im 39/223  lung]
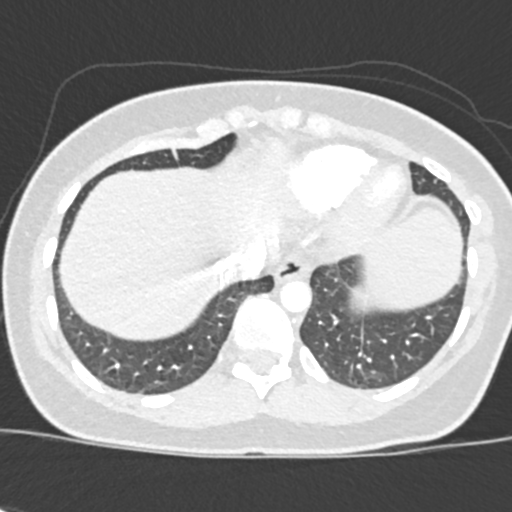
[im 58/223  soft-tissue]
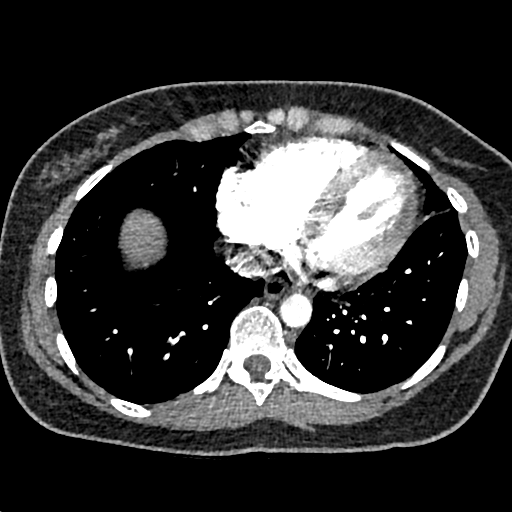
[im 68/223  lung]
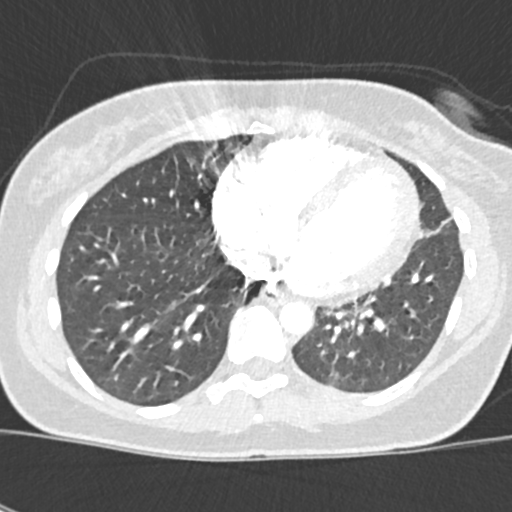
[im 87/223  soft-tissue]
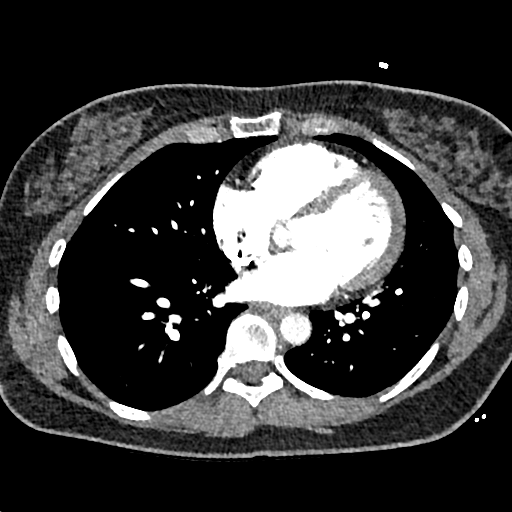
[im 97/223  lung]
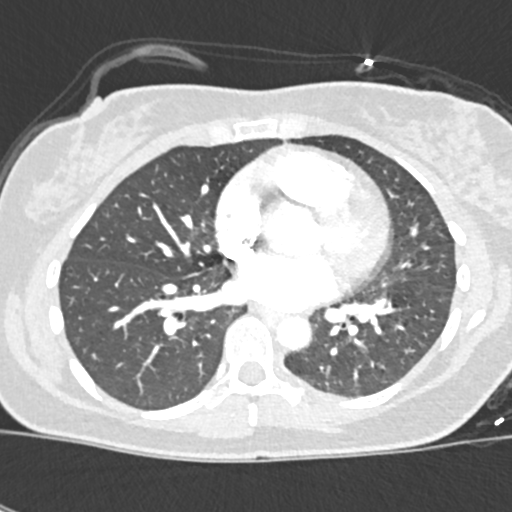
[im 116/223  soft-tissue]
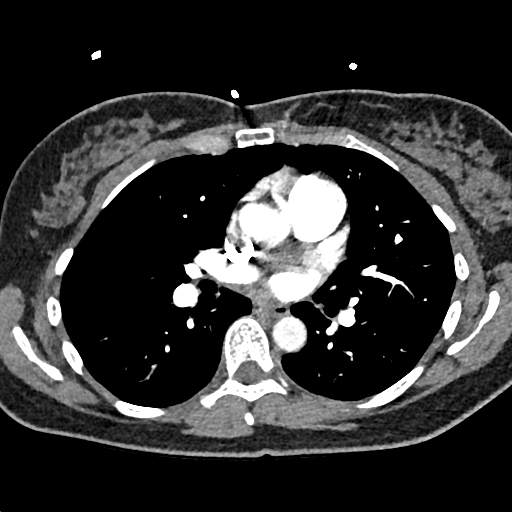
[im 126/223  lung]
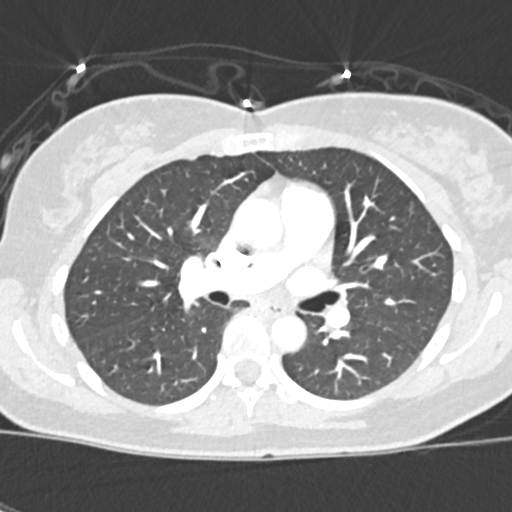
[im 136/223  soft-tissue]
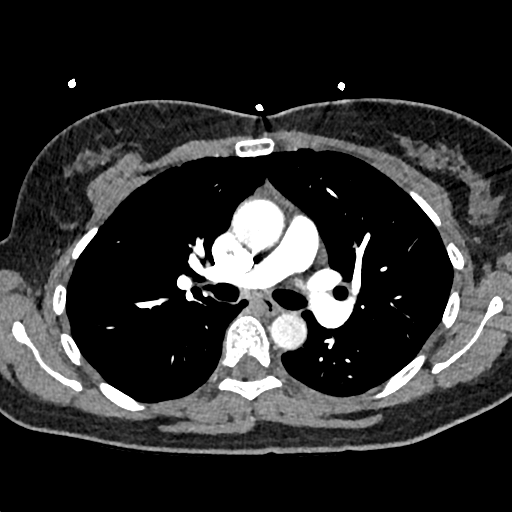
[im 155/223  lung]
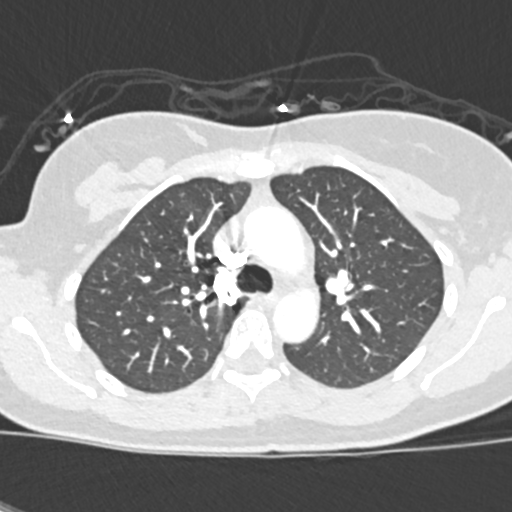
[im 165/223  soft-tissue]
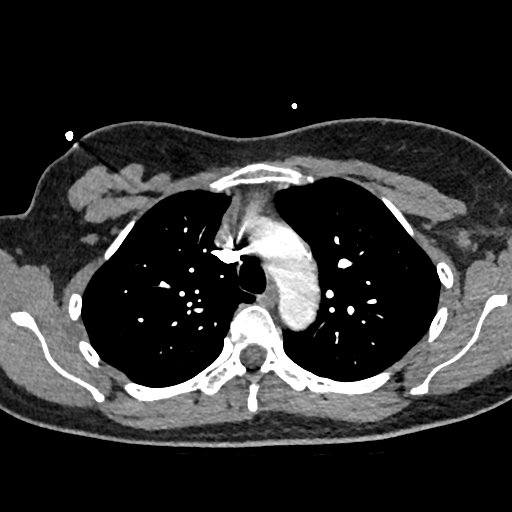
[im 184/223  lung]
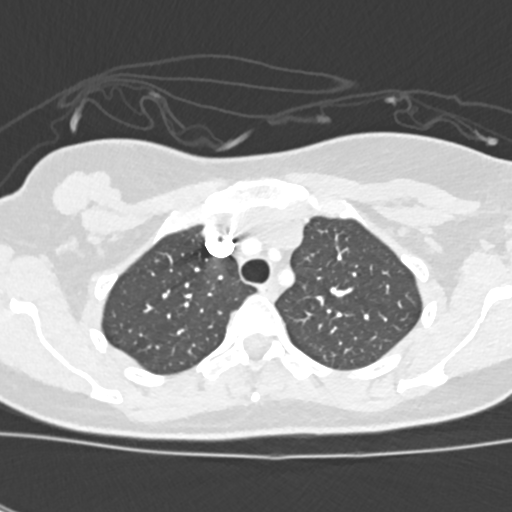
[im 194/223  soft-tissue]
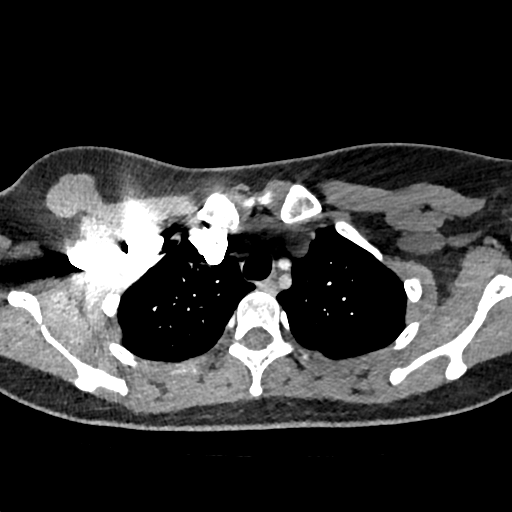
[im 213/223  lung]
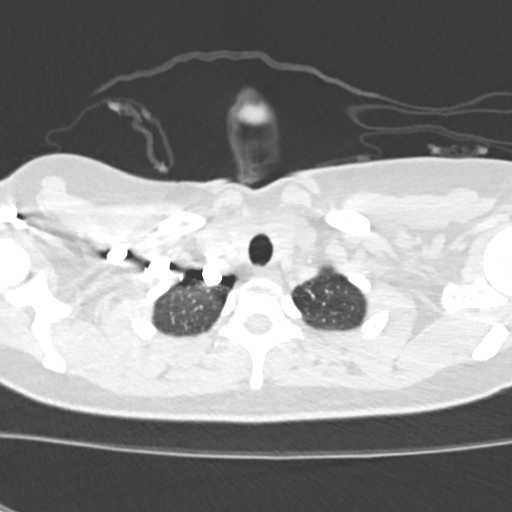

[Series 8: cor soft · coronal · 0.46mm/px · 3 of 95 slices shown]
[im 24/95  soft-tissue]
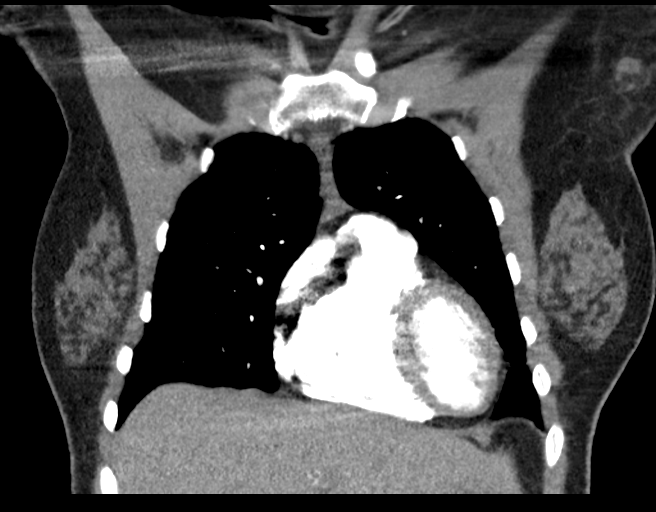
[im 48/95  soft-tissue]
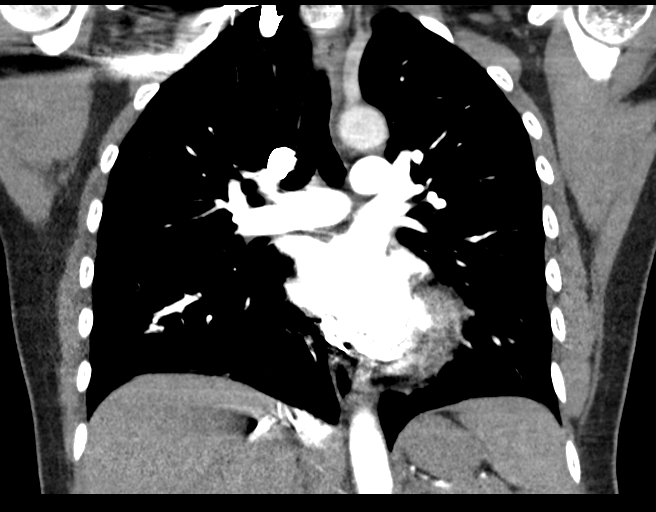
[im 71/95  soft-tissue]
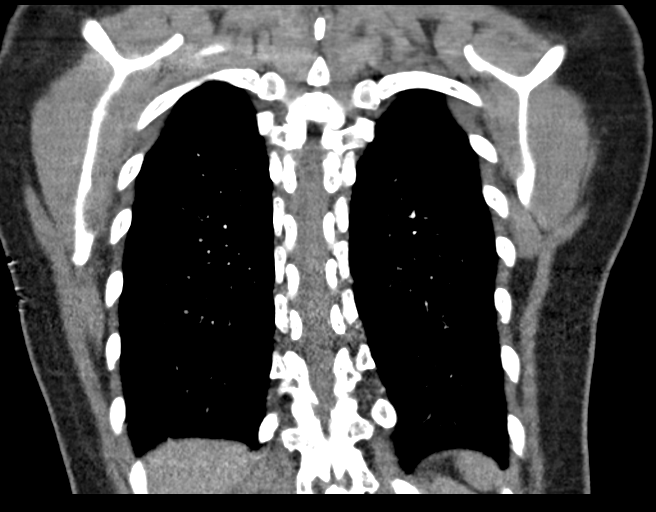

[18 of 46 positions shown; findings below may reference images not displayed]

FINDINGS: Cardiovascular: Contrast injection is sufficient to demonstrate
satisfactory opacification of the pulmonary arteries to the
segmental level. There is no pulmonary embolus or evidence of right
heart strain. The size of the main pulmonary artery is normal. Heart
size is normal, with no pericardial effusion. The course and caliber
of the aorta are normal. There is no atherosclerotic calcification.
Opacification decreased due to pulmonary arterial phase contrast
bolus timing.

Mediastinum/Nodes: No mediastinal, hilar or axillary
lymphadenopathy. Normal visualized thyroid. Thoracic esophageal
course is normal.

Lungs/Pleura: Airways are patent. No pleural effusion, lobar
consolidation, pneumothorax or pulmonary infarction.

Upper Abdomen: Contrast bolus timing is not optimized for evaluation
of the abdominal organs. The visualized portions of the organs of
the upper abdomen are normal.

Musculoskeletal: No chest wall abnormality. No bony spinal canal
stenosis.

Review of the MIP images confirms the above findings.
IMPRESSION: 1. No pulmonary embolus.
2. No acute thoracic abnormality.

## 2021-02-02 ENCOUNTER — Ambulatory Visit: Payer: 59 | Admitting: Internal Medicine

## 2021-02-02 ENCOUNTER — Other Ambulatory Visit: Payer: Self-pay

## 2021-02-02 ENCOUNTER — Encounter: Payer: Self-pay | Admitting: Internal Medicine

## 2021-02-02 VITALS — BP 122/77 | HR 63 | Temp 98.5°F | Resp 20 | Ht 60.0 in | Wt 115.0 lb

## 2021-02-02 DIAGNOSIS — Z23 Encounter for immunization: Secondary | ICD-10-CM | POA: Diagnosis not present

## 2021-02-02 DIAGNOSIS — Z1159 Encounter for screening for other viral diseases: Secondary | ICD-10-CM | POA: Diagnosis not present

## 2021-02-02 DIAGNOSIS — Z803 Family history of malignant neoplasm of breast: Secondary | ICD-10-CM | POA: Diagnosis not present

## 2021-02-02 DIAGNOSIS — E782 Mixed hyperlipidemia: Secondary | ICD-10-CM | POA: Diagnosis not present

## 2021-02-02 DIAGNOSIS — K219 Gastro-esophageal reflux disease without esophagitis: Secondary | ICD-10-CM | POA: Diagnosis not present

## 2021-02-02 DIAGNOSIS — R69 Illness, unspecified: Secondary | ICD-10-CM | POA: Diagnosis not present

## 2021-02-02 DIAGNOSIS — Z0000 Encounter for general adult medical examination without abnormal findings: Secondary | ICD-10-CM | POA: Diagnosis not present

## 2021-02-02 DIAGNOSIS — Z7689 Persons encountering health services in other specified circumstances: Secondary | ICD-10-CM

## 2021-02-02 DIAGNOSIS — Z0001 Encounter for general adult medical examination with abnormal findings: Secondary | ICD-10-CM | POA: Insufficient documentation

## 2021-02-02 DIAGNOSIS — Z114 Encounter for screening for human immunodeficiency virus [HIV]: Secondary | ICD-10-CM

## 2021-02-02 NOTE — Telephone Encounter (Signed)
Date entered into [facility].

## 2021-02-02 NOTE — Assessment & Plan Note (Signed)
Maternal aunt had breast ca, gets Mammography with Ob/Gyn - last in 05/2021 

## 2021-02-02 NOTE — Assessment & Plan Note (Signed)
Last lipid profile reviewed Recheck lipid profile Advised to follow DASH diet

## 2021-02-02 NOTE — Assessment & Plan Note (Signed)
Care established History and medications reviewed with the patient 

## 2021-02-02 NOTE — Assessment & Plan Note (Signed)
Annual exam as documented. Counseling done  re healthy lifestyle involving commitment to 150 minutes exercise per week, heart healthy diet, and attaining healthy weight.The importance of adequate sleep also discussed. Changes in health habits are decided on by the patient with goals and time frames  set for achieving them. Immunization and cancer screening needs are specifically addressed at this visit. 

## 2021-02-02 NOTE — Patient Instructions (Signed)
Please continue to follow heart healthy diet and perform moderate exercise/walking at least 150 mins/week. 

## 2021-02-02 NOTE — Progress Notes (Addendum)
New Patient Office Visit  Subjective:  Patient ID: Mackenzie Delacruz, female    DOB: 12-May-1976  Age: 45 y.o. MRN: [id number]  CC:  Chief Complaint  Patient presents with   New Patient (Initial Visit)    HPI Mackenzie Delacruz is a 45 year old female with PMH of GERD who presents for establishing care and annual physical.  She has been in good health overall. She follows up with Ob/Gyn in [place] for PAP smear and Mammography. Last PAP smear in 05/2021 - negative. She also had Mammography in 05/2021. Her maternal aunt has h/o breast ca.  She has h/o GERD and takes Omeprazole or Tums PRN.  She gets blood tests done through her work and last blood tests in 12/2020 showed LDL of 110, other tests were overall wnl.  She has had 1 dose of J&J and 1 dose of Moderna vaccine as booster. She has had flu vaccine. She received TDaP vaccine in the office today.  Past Medical History:  Diagnosis Date   GERD (gastroesophageal reflux disease)     Past Surgical History:  Procedure Laterality Date   APPENDECTOMY     HYSTEROSCOPY N/A 04/14/2019   Procedure: HYSTEROSCOPY WITH HYDROTHERMAL ABLATION;  Surgeon: Crawford Givens, MD;  Location: [place];  Service: Gynecology;  Laterality: N/A;   LAPAROSCOPIC BILATERAL SALPINGECTOMY Bilateral 04/14/2019   Procedure: LAPAROSCOPIC BILATERAL SALPINGECTOMY;  Surgeon: Crawford Givens, MD;  Location: [place];  Service: Gynecology;  Laterality: Bilateral;    History reviewed. No pertinent family history.  Social History   Socioeconomic History   Marital status: Married    Spouse name: Not on file   Number of children: Not on file   Years of education: Not on file   Highest education level: Not on file  Occupational History   Not on file  Tobacco Use   Smoking status: Former    Types: Cigarettes    Quit date: 08/01/2004    Years since quitting: 16.5   Smokeless tobacco: Never  Vaping Use   Vaping Use: Never  used  Substance and Sexual Activity   Alcohol use: Not Currently   Drug use: Never   Sexual activity: Yes  Other Topics Concern   Not on file  Social History Narrative   Not on file   Social Determinants of Health   Financial Resource Strain: Not on file  Food Insecurity: Not on file  Transportation Needs: Not on file  Physical Activity: Not on file  Stress: Not on file  Social Connections: Not on file  Intimate Partner Violence: Not on file    ROS Review of Systems  Constitutional:  Negative for chills and fever.  HENT:  Negative for congestion, sinus pressure, sinus pain and sore throat.   Eyes:  Negative for pain and discharge.  Respiratory:  Negative for cough and shortness of breath.   Cardiovascular:  Negative for chest pain and palpitations.  Gastrointestinal:  Negative for abdominal pain, constipation, diarrhea, nausea and vomiting.  Endocrine: Negative for polydipsia and polyuria.  Genitourinary:  Negative for dysuria and hematuria.  Musculoskeletal:  Negative for neck pain and neck stiffness.  Skin:  Negative for rash.  Neurological:  Negative for dizziness and weakness.  Psychiatric/Behavioral:  Negative for agitation and behavioral problems.    Objective:   Today's Vitals: BP 122/77 (BP Location: Right Arm, Patient Position: Sitting, Cuff Size: Normal)   Pulse 63   Temp 98.5 F (36.9 C)   Resp 20   Ht 5' (  1.524 m)   Wt 115 lb (52.2 kg)   SpO2 97%   BMI 22.46 kg/m   Physical Exam Vitals reviewed.  Constitutional:      General: She is not in acute distress.    Appearance: She is not diaphoretic.  HENT:     Head: Normocephalic and atraumatic.     Nose: Nose normal.     Mouth/Throat:     Mouth: Mucous membranes are moist.  Eyes:     General: No scleral icterus.    Extraocular Movements: Extraocular movements intact.  Cardiovascular:     Rate and Rhythm: Normal rate and regular rhythm.     Pulses: Normal pulses.     Heart sounds: Normal heart  sounds. No murmur heard. Pulmonary:     Breath sounds: Normal breath sounds. No wheezing or rales.  Abdominal:     Palpations: Abdomen is soft.     Tenderness: There is no abdominal tenderness.  Musculoskeletal:     Cervical back: Neck supple. No tenderness.     Right lower leg: No edema.     Left lower leg: No edema.  Skin:    General: Skin is warm.     Findings: No rash.  Neurological:     General: No focal deficit present.     Mental Status: She is alert and oriented to person, place, and time.     Cranial Nerves: No cranial nerve deficit.     Sensory: No sensory deficit.     Motor: No weakness.  Psychiatric:        Mood and Affect: Mood normal.        Behavior: Behavior normal.    Assessment & Plan:   Problem List Items Addressed This Visit       Annual physical exam - Primary   Annual exam as documented. Counseling done  re healthy lifestyle involving commitment to 150 minutes exercise per week, heart healthy diet, and attaining healthy weight.The importance of adequate sleep also discussed. Changes in health habits are decided on by the patient with goals and time frames  set for achieving them. Immunization and cancer screening needs are specifically addressed at this visit.     Relevant Orders  CBC with Differential/Platelet  CMP14+EGFR  Lipid Profile  HgB A1c  TSH  Vitamin D (25 hydroxy)   Encounter to establish care   Care established History and medications reviewed with the patient       Digestive   Gastroesophageal reflux disease without esophagitis    Well-controlled with PRN Omeprazole Avoid hot and spicy food      Relevant Medications   omeprazole (PRILOSEC) 20 MG capsule        Family history of breast cancer    Maternal aunt had breast ca, gets Mammography with Ob/Gyn - last in 05/2021      Mixed hyperlipidemia    Last lipid profile reviewed Recheck lipid profile Advised to follow DASH diet      Other Visit Diagnoses      Encounter for screening for HIV       Relevant Orders   HIV antibody (with reflex)   Need for hepatitis C screening test       Relevant Orders   Hepatitis C Antibody       Outpatient Encounter Medications as of 02/02/2021  Medication Sig   Multiple Vitamin (MULTIVITAMIN WITH MINERALS) TABS tablet Take 1 tablet by mouth daily.   omeprazole (PRILOSEC) 20 MG capsule Take 20 mg by  mouth daily as needed.   [DISCONTINUED] esomeprazole (NEXIUM) 40 MG capsule Take 40 mg by mouth as needed.   [DISCONTINUED] omeprazole (PRILOSEC) 40 MG capsule Take 1 capsule (40 mg total) by mouth daily.   [DISCONTINUED] HYDROcodone-acetaminophen (NORCO/VICODIN) 5-325 MG tablet take 1 tablet po every 6 hours as needed for severe post operative pain   [DISCONTINUED] ibuprofen (ADVIL) 600 MG tablet take 1 tablet po pc every 6 hours for 5 days (1st dose 8:30 pm today)   No facility-administered encounter medications on file as of 02/02/2021.    Follow-up: Return in about 1 year (around 02/02/2022) for Annual physical.   Lindell Spar, MD

## 2021-02-02 NOTE — Assessment & Plan Note (Signed)
Well-controlled with PRN Omeprazole Avoid hot and spicy food

## 2021-02-02 NOTE — Addendum Note (Signed)
Addended by: Dellia Cloud on: 02/02/2021 09:58 AM   Modules accepted: Orders

## 2021-10-15 DIAGNOSIS — Z1231 Encounter for screening mammogram for malignant neoplasm of breast: Secondary | ICD-10-CM | POA: Diagnosis not present

## 2022-02-01 ENCOUNTER — Encounter: Payer: 59 | Admitting: Internal Medicine

## 2022-02-13 ENCOUNTER — Encounter: Payer: 59 | Admitting: Internal Medicine

## 2022-02-15 ENCOUNTER — Encounter: Payer: BC Managed Care – PPO | Admitting: Internal Medicine

## 2022-02-28 ENCOUNTER — Encounter: Payer: Self-pay | Admitting: Internal Medicine

## 2022-02-28 ENCOUNTER — Ambulatory Visit (INDEPENDENT_AMBULATORY_CARE_PROVIDER_SITE_OTHER): Payer: BC Managed Care – PPO | Admitting: Internal Medicine

## 2022-02-28 VITALS — BP 108/82 | HR 60 | Resp 18 | Ht 59.0 in | Wt 117.8 lb

## 2022-02-28 DIAGNOSIS — E782 Mixed hyperlipidemia: Secondary | ICD-10-CM | POA: Diagnosis not present

## 2022-02-28 DIAGNOSIS — Z1159 Encounter for screening for other viral diseases: Secondary | ICD-10-CM

## 2022-02-28 DIAGNOSIS — M79604 Pain in right leg: Secondary | ICD-10-CM

## 2022-02-28 DIAGNOSIS — Z23 Encounter for immunization: Secondary | ICD-10-CM | POA: Diagnosis not present

## 2022-02-28 DIAGNOSIS — Z0001 Encounter for general adult medical examination with abnormal findings: Secondary | ICD-10-CM | POA: Diagnosis not present

## 2022-02-28 DIAGNOSIS — E559 Vitamin D deficiency, unspecified: Secondary | ICD-10-CM

## 2022-02-28 DIAGNOSIS — Z803 Family history of malignant neoplasm of breast: Secondary | ICD-10-CM

## 2022-02-28 NOTE — Assessment & Plan Note (Signed)
Maternal aunt had breast ca, gets Mammography with Ob/Gyn - last in 05/2021

## 2022-02-28 NOTE — Telephone Encounter (Signed)
received

## 2022-02-28 NOTE — Patient Instructions (Addendum)
Please take Magnesium 200 mg once daily for leg cramping/pain.  Please continue to follow low cholesterol diet and perform moderate exercise/walking at least 150 mins/week.  Follow these instructions at home: Eating and drinking A plate with examples of foods in a healthy diet.   Eat a healthy, balanced diet. This diet includes: Daily servings of a variety of fresh, frozen, or canned fruits and vegetables. Daily servings of whole grain foods that are rich in fiber. Foods that are low in saturated fats and trans fats. These include poultry and fish without skin, lean cuts of meat, and low-fat dairy products. A variety of fish, especially oily fish that contain omega-3 fatty acids. Aim to eat fish at least 2 times a week. Avoid foods and drinks that have added sugar. Use healthy cooking methods, such as roasting, grilling, broiling, baking, poaching, steaming, and stir-frying. Do not fry your food except for stir-frying. If you drink alcohol: Limit how much you have to: 0-1 drink a day for women who are not pregnant. 0-2 drinks a day for men. Know how much alcohol is in a drink. In the [place] one drink equals one 12 oz bottle of beer (355 mL), one 5 oz glass of wine (148 mL), or one 1 oz glass of hard liquor (44 mL). Lifestyle Illustration of a person walking a dog.   Get regular exercise. Aim to exercise for a total of 150 minutes a week. Increase your activity level by doing activities such as gardening, walking, and taking the stairs. Do not use any products that contain nicotine or tobacco. These products include cigarettes, chewing tobacco, and vaping devices, such as e-cigarettes. If you need help quitting, ask your health care provider. General instructions Take over-the-counter and prescription medicines only as told by your health care provider. Keep all follow-up visits. This is important.

## 2022-02-28 NOTE — Assessment & Plan Note (Signed)
Last lipid profile reviewed Advised to follow low-cholesterol diet for now 

## 2022-02-28 NOTE — Progress Notes (Addendum)
Established Patient Office Visit  Subjective:  Patient ID: Mackenzie Delacruz, female    DOB: [birth year]  Age: 46 y.o. MRN: [id number]  CC:  Chief Complaint  Patient presents with   Annual Exam    Annual exam patient started having right leg pain on side 02-28-22    HPI Mackenzie Delacruz is a 46 y.o. female with past medical history of HLD who presents for annual physical.  She has shared blood test results through MyChart, which were done at her workplace.  It showed mildly elevated LDL.  She has been trying to follow low cholesterol diet and has increased portion of green vegetables.  She reports intermittent episodes of right leg pain in the calf area, especially at nighttime.  She also reports intermittent numbness of the LE after prolonged sitting.  Of note, she works from home and has to sit for a long time during her work, which provokes numbness of the LE upon standing.       Past Medical History:  Diagnosis Date   GERD (gastroesophageal reflux disease)     Past Surgical History:  Procedure Laterality Date   APPENDECTOMY     HYSTEROSCOPY N/A 04/14/2019   Procedure: HYSTEROSCOPY WITH HYDROTHERMAL ABLATION;  Surgeon: Crawford Givens, MD;  Location: [facility];  Service: Gynecology;  Laterality: N/A;   LAPAROSCOPIC BILATERAL SALPINGECTOMY Bilateral 04/14/2019   Procedure: LAPAROSCOPIC BILATERAL SALPINGECTOMY;  Surgeon: Crawford Givens, MD;  Location: [facility];  Service: Gynecology;  Laterality: Bilateral;    History reviewed. No pertinent family history.  Social History   Socioeconomic History   Marital status: Married    Spouse name: Not on file   Number of children: Not on file   Years of education: Not on file   Highest education level: Not on file  Occupational History   Not on file  Tobacco Use   Smoking status: Former    Types: Cigarettes    Quit date: 08/01/2004    Years since quitting: 17.5   Smokeless tobacco: Never   Vaping Use   Vaping Use: Never used  Substance and Sexual Activity   Alcohol use: Not Currently   Drug use: Never   Sexual activity: Yes  Other Topics Concern   Not on file  Social History Narrative   Not on file   Social Determinants of Health   Financial Resource Strain: Not on file  Food Insecurity: Not on file  Transportation Needs: Not on file  Physical Activity: Not on file  Stress: Not on file  Social Connections: Not on file  Intimate Partner Violence: Not on file    Outpatient Medications Prior to Visit  Medication Sig Dispense Refill   Multiple Vitamin (MULTIVITAMIN WITH MINERALS) TABS tablet Take 1 tablet by mouth daily.     omeprazole (PRILOSEC) 20 MG capsule Take 20 mg by mouth daily as needed. (Patient not taking: Reported on 02/28/2022)     No facility-administered medications prior to visit.    Allergies  Allergen Reactions   Sulfa Antibiotics Hives    ROS Review of Systems  Constitutional:  Negative for chills and fever.  HENT:  Negative for congestion, sinus pressure, sinus pain and sore throat.   Eyes:  Negative for pain and discharge.  Respiratory:  Negative for cough and shortness of breath.   Cardiovascular:  Negative for chest pain and palpitations.  Gastrointestinal:  Negative for abdominal pain, constipation, diarrhea, nausea and vomiting.  Endocrine: Negative for polydipsia and polyuria.  Genitourinary:  Negative for dysuria and hematuria.  Musculoskeletal:  Negative for neck pain and neck stiffness.  Skin:  Negative for rash.  Neurological:  Negative for dizziness and weakness.  Psychiatric/Behavioral:  Negative for agitation and behavioral problems.       Objective:    Physical Exam Vitals reviewed.  Constitutional:      General: She is not in acute distress.    Appearance: She is not diaphoretic.  HENT:     Head: Normocephalic and atraumatic.     Nose: Nose normal.     Mouth/Throat:     Mouth: Mucous membranes are moist.   Eyes:     General: No scleral icterus.    Extraocular Movements: Extraocular movements intact.  Cardiovascular:     Rate and Rhythm: Normal rate and regular rhythm.     Pulses: Normal pulses.     Heart sounds: Normal heart sounds. No murmur heard. Pulmonary:     Breath sounds: Normal breath sounds. No wheezing or rales.  Abdominal:     Palpations: Abdomen is soft.     Tenderness: There is no abdominal tenderness.  Musculoskeletal:     Cervical back: Neck supple. No tenderness.     Right lower leg: No edema.     Left lower leg: No edema.  Skin:    General: Skin is warm.     Findings: No rash.  Neurological:     General: No focal deficit present.     Mental Status: She is alert and oriented to person, place, and time.     Cranial Nerves: No cranial nerve deficit.     Sensory: No sensory deficit.     Motor: No weakness.  Psychiatric:        Mood and Affect: Mood normal.        Behavior: Behavior normal.     BP 108/82 (BP Location: Right Arm, Patient Position: Sitting, Cuff Size: Normal)   Pulse 60   Resp 18   Ht 4\' 11"  (1.499 m)   Wt 117 lb 12.8 oz (53.4 kg)   SpO2 97%   BMI 23.79 kg/m  Wt Readings from Last 3 Encounters:  02/28/22 117 lb 12.8 oz (53.4 kg)  02/02/21 115 lb (52.2 kg)  04/14/19 111 lb (50.3 kg)    No results found for: "TSH" Lab Results  Component Value Date   WBC 7.6 09/14/2019   HGB 13.4 09/14/2019   HCT 40.3 09/14/2019   MCV 90.6 09/14/2019   PLT 220 09/14/2019   Lab Results  Component Value Date   NA 135 09/14/2019   K 4.0 09/14/2019   CO2 25 09/14/2019   GLUCOSE 100 (H) 09/14/2019   BUN 14 09/14/2019   CREATININE 0.79 09/14/2019   CALCIUM 9.4 09/14/2019   ANIONGAP 11 09/14/2019   No results found for: "CHOL" No results found for: "HDL" No results found for: "LDLCALC" No results found for: "TRIG" No results found for: "CHOLHDL" No results found for: "HGBA1C"    Assessment & Plan:   Problem List Items Addressed This Visit        Other   Encounter for general adult medical examination with abnormal findings - Primary    Physical exam as documented. Fasting blood tests reviewed. Added Hep C and Vit D. Flu vaccine today. PAP and Mammography at Ob/Gyn.      Family history of breast cancer    Maternal aunt had breast ca, gets Mammography with Ob/Gyn - last in 05/2021  Mixed hyperlipidemia    Last lipid profile reviewed Advised to follow low cholesterol diet for now      Right leg pain    Intermittent, could be due to micronutrient deficiency and/or referred pain from back due to prolonged sitting Advised to take magnesium supplement Advised to take breaks every 1-2 hours and walk few steps      Other Visit Diagnoses     Need for immunization against influenza       Relevant Orders   Flu Vaccine QUAD 43mo+IM (Fluarix, Fluzone & Alfiuria Quad PF) (Completed)   Need for hepatitis C screening test       Relevant Orders   Hepatitis C Antibody   Vitamin D deficiency       Relevant Orders   Vitamin D (25 hydroxy)       No orders of the defined types were placed in this encounter.   Follow-up: Return in about 1 year (around 03/01/2023) for Annual physical.    Anabel Halon, MD

## 2022-02-28 NOTE — Assessment & Plan Note (Signed)
Physical exam as documented. Fasting blood tests reviewed. Added Hep C and Vit D. Flu vaccine today. PAP and Mammography at Ob/Gyn.

## 2022-02-28 NOTE — Assessment & Plan Note (Signed)
Intermittent, could be due to micronutrient deficiency and/or referred pain from back due to prolonged sitting Advised to take magnesium supplement Advised to take breaks every 1-2 hours and walk few steps

## 2022-03-01 LAB — HEPATITIS C ANTIBODY: Hep C Virus Ab: NONREACTIVE

## 2022-03-01 LAB — VITAMIN D 25 HYDROXY (VIT D DEFICIENCY, FRACTURES): Vit D, 25-Hydroxy: 36.7 ng/mL (ref 30.0–100.0)

## 2022-03-19 DIAGNOSIS — M7911 Myalgia of mastication muscle: Secondary | ICD-10-CM | POA: Diagnosis not present

## 2022-03-19 DIAGNOSIS — M26633 Articular disc disorder of bilateral temporomandibular joint: Secondary | ICD-10-CM | POA: Diagnosis not present

## 2022-03-19 DIAGNOSIS — M7912 Myalgia of auxiliary muscles, head and neck: Secondary | ICD-10-CM | POA: Diagnosis not present

## 2022-03-19 DIAGNOSIS — M2652 Limited mandibular range of motion: Secondary | ICD-10-CM | POA: Diagnosis not present

## 2022-03-20 ENCOUNTER — Other Ambulatory Visit: Payer: Self-pay | Admitting: Dentistry

## 2022-03-20 DIAGNOSIS — M26632 Articular disc disorder of left temporomandibular joint: Secondary | ICD-10-CM

## 2022-03-21 ENCOUNTER — Ambulatory Visit
Admission: RE | Admit: 2022-03-21 | Discharge: 2022-03-21 | Disposition: A | Discharge status: Home / Self Care | Payer: BC Managed Care – PPO | Source: Ambulatory Visit | Attending: Dentistry | Admitting: Dentistry

## 2022-03-21 DIAGNOSIS — R600 Localized edema: Secondary | ICD-10-CM | POA: Diagnosis not present

## 2022-03-21 DIAGNOSIS — M26602 Left temporomandibular joint disorder, unspecified: Secondary | ICD-10-CM | POA: Diagnosis not present

## 2022-03-21 DIAGNOSIS — M26632 Articular disc disorder of left temporomandibular joint: Secondary | ICD-10-CM

## 2022-03-27 ENCOUNTER — Other Ambulatory Visit: Payer: BC Managed Care – PPO

## 2022-07-11 ENCOUNTER — Encounter (INDEPENDENT_AMBULATORY_CARE_PROVIDER_SITE_OTHER): Payer: Self-pay

## 2022-08-01 ENCOUNTER — Encounter: Payer: No Typology Code available for payment source | Admitting: Radiology

## 2022-11-20 ENCOUNTER — Ambulatory Visit: Payer: No Typology Code available for payment source | Admitting: Internal Medicine

## 2022-11-20 ENCOUNTER — Encounter: Payer: Self-pay | Admitting: Internal Medicine

## 2022-11-20 VITALS — BP 110/73 | HR 76 | Ht 60.0 in | Wt 120.4 lb

## 2022-11-20 DIAGNOSIS — J0110 Acute frontal sinusitis, unspecified: Secondary | ICD-10-CM | POA: Insufficient documentation

## 2022-11-20 MED ORDER — AMOXICILLIN-POT CLAVULANATE 875-125 MG PO TABS: 1.0000 | ORAL_TABLET | Freq: Two times a day (BID) | ORAL | 0 refills | Status: DC

## 2022-11-20 MED ORDER — BENZONATATE 100 MG PO CAPS: 100.0000 mg | ORAL_CAPSULE | Freq: Two times a day (BID) | ORAL | 0 refills | Status: DC | PRN

## 2022-11-20 NOTE — Patient Instructions (Addendum)
Please start taking Augmentin as prescribed.  Please take Tessalon as needed for cough.  Okay to take Robitussin DM Max as needed for cough.  Okay to use Nasal saline spray as needed for nasal congestion.

## 2022-11-20 NOTE — Progress Notes (Signed)
Acute Office Visit  Subjective:    Patient ID: Mackenzie Delacruz, female    DOB: 11/08/1975, 46 y.o.   MRN: [id number]  Chief Complaint  Patient presents with   Cough    Patient has had a cough for three weeks, it is worse at night, she has taken nyquil but is not getting relief     HPI Patient is in today for complaint of cough for the last 3 weeks.  She also reports postnasal drip and sinus pressure related headache and heaviness behind her eyes.  She reports that she wakes up at midnight from coughing, but denies any dyspnea or wheezing.  She has had negative COVID test at home.  She has tried NyQuil and OTC home remedies for cough.  Past Medical History:  Diagnosis Date   GERD (gastroesophageal reflux disease)     Past Surgical History:  Procedure Laterality Date   APPENDECTOMY     HYSTEROSCOPY N/A 04/14/2019   Procedure: HYSTEROSCOPY WITH HYDROTHERMAL ABLATION;  Surgeon: Jaymes Graff, MD;  Location: [facility];  Service: Gynecology;  Laterality: N/A;   LAPAROSCOPIC BILATERAL SALPINGECTOMY Bilateral 04/14/2019   Procedure: LAPAROSCOPIC BILATERAL SALPINGECTOMY;  Surgeon: Jaymes Graff, MD;  Location: [facility];  Service: Gynecology;  Laterality: Bilateral;    History reviewed. No pertinent family history.  Social History   Socioeconomic History   Marital status: Married    Spouse name: Not on file   Number of children: Not on file   Years of education: Not on file   Highest education level: Bachelor's degree (e.g., BA, AB, BS)  Occupational History   Not on file  Tobacco Use   Smoking status: Former    Types: Cigarettes    Quit date: 08/01/2004    Years since quitting: 18.3   Smokeless tobacco: Never  Vaping Use   Vaping Use: Never used  Substance and Sexual Activity   Alcohol use: Not Currently   Drug use: Never   Sexual activity: Yes  Other Topics Concern   Not on file  Social History Narrative   Not on file   Social  Determinants of Health   Financial Resource Strain: Low Risk  (11/19/2022)   Overall Financial Resource Strain (CARDIA)    Difficulty of Paying Living Expenses: Not hard at all  Food Insecurity: No Food Insecurity (11/19/2022)   Hunger Vital Sign    Worried About Running Out of Food in the Last Year: Never true    Ran Out of Food in the Last Year: Never true  Transportation Needs: No Transportation Needs (11/19/2022)   PRAPARE - Administrator, Civil Service (Medical): No    Lack of Transportation (Non-Medical): No  Physical Activity: Insufficiently Active (11/19/2022)   Exercise Vital Sign    Days of Exercise per Week: 4 days    Minutes of Exercise per Session: 30 min  Stress: Stress Concern Present (11/19/2022)   [employer] of Occupational Health - Occupational Stress Questionnaire    Feeling of Stress : To some extent  Social Connections: Moderately Integrated (11/19/2022)   Social Connection and Isolation Panel [NHANES]    Frequency of Communication with Friends and Family: More than three times a week    Frequency of Social Gatherings with Friends and Family: Once a week    Attends Religious Services: More than 4 times per year    Active Member of [employer] or Organizations: No    Attends Banker Meetings: Not on file  Marital Status: Married  Catering manager Violence: Not on file    Outpatient Medications Prior to Visit  Medication Sig Dispense Refill   Multiple Vitamin (MULTIVITAMIN WITH MINERALS) TABS tablet Take 1 tablet by mouth daily. (Patient not taking: Reported on 11/20/2022)     No facility-administered medications prior to visit.    Allergies  Allergen Reactions   Sulfa Antibiotics Hives    Review of Systems  Constitutional:  Negative for chills and fever.  HENT:  Positive for congestion, postnasal drip, sinus pressure and sore throat.   Respiratory:  Positive for cough. Negative for shortness of breath.   Cardiovascular:  Negative  for chest pain and palpitations.  Gastrointestinal:  Negative for diarrhea, nausea and vomiting.  Genitourinary:  Negative for dysuria and hematuria.  Musculoskeletal:  Negative for neck pain and neck stiffness.  Skin:  Negative for rash.  Neurological:  Negative for dizziness and weakness.  Psychiatric/Behavioral:  Negative for agitation and behavioral problems.        Objective:    Physical Exam Vitals reviewed.  Constitutional:      General: She is not in acute distress.    Appearance: She is not diaphoretic.  HENT:     Head: Normocephalic and atraumatic.     Nose: Congestion present.     Right Sinus: Frontal sinus tenderness present.     Left Sinus: Frontal sinus tenderness present.     Mouth/Throat:     Mouth: Mucous membranes are moist.  Eyes:     General: No scleral icterus.    Extraocular Movements: Extraocular movements intact.  Cardiovascular:     Rate and Rhythm: Normal rate and regular rhythm.     Pulses: Normal pulses.     Heart sounds: Normal heart sounds. No murmur heard. Pulmonary:     Breath sounds: Normal breath sounds. No wheezing or rales.  Musculoskeletal:     Cervical back: Neck supple. No tenderness.     Right lower leg: No edema.     Left lower leg: No edema.  Skin:    General: Skin is warm.     Findings: No rash.  Neurological:     General: No focal deficit present.     Mental Status: She is alert and oriented to person, place, and time.  Psychiatric:        Mood and Affect: Mood normal.        Behavior: Behavior normal.     BP 110/73 (BP Location: Right Arm, Patient Position: Sitting, Cuff Size: Normal)   Pulse 76   Ht 5' (1.524 m)   Wt 120 lb 6.4 oz (54.6 kg)   SpO2 96%   BMI 23.51 kg/m  Wt Readings from Last 3 Encounters:  11/20/22 120 lb 6.4 oz (54.6 kg)  02/28/22 117 lb 12.8 oz (53.4 kg)  02/02/21 115 lb (52.2 kg)        Assessment & Plan:   Problem List Items Addressed This Visit       Respiratory   Acute  non-recurrent frontal sinusitis - Primary    Her cough is likely due to postnasal drip Started empiric Augmentin as she has persistent symptoms despite symptomatic treatment Tessalon as needed for cough Robitussin DM Max as needed for cough Advised to use nasal saline spray as needed for nasal congestion Advised to use humidifier and/or vaporizer      Relevant Medications   amoxicillin-clavulanate (AUGMENTIN) 875-125 MG tablet   benzonatate (TESSALON) 100 MG capsule     Meds  ordered this encounter  Medications   amoxicillin-clavulanate (AUGMENTIN) 875-125 MG tablet    Sig: Take 1 tablet by mouth 2 (two) times daily.    Dispense:  14 tablet    Refill:  0   benzonatate (TESSALON) 100 MG capsule    Sig: Take 1 capsule (100 mg total) by mouth 2 (two) times daily as needed for cough.    Dispense:  20 capsule    Refill:  0     Idan Prime Concha Se, MD

## 2022-11-20 NOTE — Assessment & Plan Note (Signed)
Her cough is likely due to postnasal drip Started empiric Augmentin as she has persistent symptoms despite symptomatic treatment Tessalon as needed for cough Robitussin DM Max as needed for cough Advised to use nasal saline spray as needed for nasal congestion Advised to use humidifier and/or vaporizer

## 2022-12-11 ENCOUNTER — Other Ambulatory Visit (HOSPITAL_COMMUNITY): Payer: Self-pay | Admitting: Internal Medicine

## 2022-12-11 DIAGNOSIS — Z1231 Encounter for screening mammogram for malignant neoplasm of breast: Secondary | ICD-10-CM

## 2022-12-13 ENCOUNTER — Inpatient Hospital Stay (HOSPITAL_COMMUNITY): Admission: RE | Admit: 2022-12-13 | Payer: No Typology Code available for payment source | Source: Ambulatory Visit

## 2022-12-13 ENCOUNTER — Encounter (HOSPITAL_COMMUNITY): Payer: Self-pay

## 2022-12-13 ENCOUNTER — Ambulatory Visit (HOSPITAL_COMMUNITY)
Admission: RE | Admit: 2022-12-13 | Discharge: 2022-12-13 | Disposition: A | Discharge status: Home / Self Care | Payer: No Typology Code available for payment source | Source: Ambulatory Visit | Attending: Internal Medicine | Admitting: Internal Medicine

## 2022-12-13 DIAGNOSIS — Z1231 Encounter for screening mammogram for malignant neoplasm of breast: Secondary | ICD-10-CM | POA: Insufficient documentation

## 2022-12-18 ENCOUNTER — Inpatient Hospital Stay
Admission: RE | Admit: 2022-12-18 | Discharge: 2022-12-18 | Disposition: A | Discharge status: Home / Self Care | Payer: Self-pay | Source: Ambulatory Visit | Attending: Internal Medicine | Admitting: Internal Medicine

## 2022-12-18 ENCOUNTER — Other Ambulatory Visit (HOSPITAL_COMMUNITY): Payer: Self-pay | Admitting: Internal Medicine

## 2022-12-18 DIAGNOSIS — Z1231 Encounter for screening mammogram for malignant neoplasm of breast: Secondary | ICD-10-CM

## 2023-01-15 LAB — HM PAP SMEAR: HM Pap smear: NORMAL

## 2023-02-28 ENCOUNTER — Encounter: Payer: BC Managed Care – PPO | Admitting: Internal Medicine

## 2023-03-03 ENCOUNTER — Encounter: Payer: BC Managed Care – PPO | Admitting: Internal Medicine

## 2023-03-13 ENCOUNTER — Ambulatory Visit (INDEPENDENT_AMBULATORY_CARE_PROVIDER_SITE_OTHER): Payer: No Typology Code available for payment source | Admitting: Internal Medicine

## 2023-03-13 ENCOUNTER — Encounter: Payer: Self-pay | Admitting: Internal Medicine

## 2023-03-13 VITALS — BP 118/84 | HR 75 | Ht 60.0 in | Wt 126.4 lb

## 2023-03-13 DIAGNOSIS — R0683 Snoring: Secondary | ICD-10-CM | POA: Diagnosis not present

## 2023-03-13 DIAGNOSIS — Z23 Encounter for immunization: Secondary | ICD-10-CM

## 2023-03-13 DIAGNOSIS — Z800 Family history of malignant neoplasm of digestive organs: Secondary | ICD-10-CM | POA: Insufficient documentation

## 2023-03-13 DIAGNOSIS — Z0001 Encounter for general adult medical examination with abnormal findings: Secondary | ICD-10-CM | POA: Diagnosis not present

## 2023-03-13 DIAGNOSIS — K219 Gastro-esophageal reflux disease without esophagitis: Secondary | ICD-10-CM

## 2023-03-13 NOTE — Assessment & Plan Note (Addendum)
Advised to try Pepcid PRN Avoid hot and spicy food

## 2023-03-13 NOTE — Assessment & Plan Note (Signed)
Physical exam as documented. Fasting blood tests reviewed. Flu vaccine today. PAP and Mammography at Ob/Gyn.

## 2023-03-13 NOTE — Progress Notes (Signed)
Established Patient Office Visit  Subjective:  Patient ID: Mackenzie Delacruz, female    DOB: 1976-01-31  Age: 47 y.o. MRN: [id number]  CC:  Chief Complaint  Patient presents with   Annual Exam   Sleep Apnea    Husband concerned for sleep apnea     HPI Mackenzie Delacruz is a 47 y.o. female with past medical history of HLD who presents for annual physical.  She has been brought blood test results from her workplace, which have been discussed with the patient in detail.  She mentions that her husband is concerned about her having sleep apnea.  She has snoring and her husband has noticed hypopnea like episodes.  Of note, her BMI is less than 25, does not have hypertension or wide neck circumference.  Does not have family history of sleep apnea.  She reports that she has been stressed lately due to her mother's recent diagnosis of myasthenia gravis and has had to drive to [facility] multiple times to take care of her.  She attributes her snoring to deep sleep.  She has acid reflux, and has been avoiding to take omeprazole.  She has tried Tums with mild relief.  Denies any nausea or vomiting.  Past Medical History:  Diagnosis Date   GERD (gastroesophageal reflux disease)     Past Surgical History:  Procedure Laterality Date   APPENDECTOMY     HYSTEROSCOPY N/A 04/14/2019   Procedure: HYSTEROSCOPY WITH HYDROTHERMAL ABLATION;  Surgeon: Jaymes Graff, MD;  Location: [facility];  Service: Gynecology;  Laterality: N/A;   LAPAROSCOPIC BILATERAL SALPINGECTOMY Bilateral 04/14/2019   Procedure: LAPAROSCOPIC BILATERAL SALPINGECTOMY;  Surgeon: Jaymes Graff, MD;  Location: [facility];  Service: Gynecology;  Laterality: Bilateral;    History reviewed. No pertinent family history.  Social History   Socioeconomic History   Marital status: Married    Spouse name: Not on file   Number of children: Not on file   Years of education: Not on file   Highest  education level: Bachelor's degree (e.g., BA, AB, BS)  Occupational History   Not on file  Tobacco Use   Smoking status: Former    Current packs/day: 0.00    Types: Cigarettes    Quit date: 08/01/2004    Years since quitting: 18.6   Smokeless tobacco: Never  Vaping Use   Vaping status: Never Used  Substance and Sexual Activity   Alcohol use: Not Currently   Drug use: Never   Sexual activity: Yes  Other Topics Concern   Not on file  Social History Narrative   Not on file   Social Determinants of Health   Financial Resource Strain: Low Risk  (11/19/2022)   Overall Financial Resource Strain (CARDIA)    Difficulty of Paying Living Expenses: Not hard at all  Food Insecurity: No Food Insecurity (11/19/2022)   Hunger Vital Sign    Worried About Running Out of Food in the Last Year: Never true    Ran Out of Food in the Last Year: Never true  Transportation Needs: No Transportation Needs (11/19/2022)   PRAPARE - Administrator, Civil Service (Medical): No    Lack of Transportation (Non-Medical): No  Physical Activity: Insufficiently Active (11/19/2022)   Exercise Vital Sign    Days of Exercise per Week: 4 days    Minutes of Exercise per Session: 30 min  Stress: Stress Concern Present (11/19/2022)   [employer] of Occupational Health - Occupational Stress Questionnaire  Feeling of Stress : To some extent  Social Connections: Moderately Integrated (11/19/2022)   Social Connection and Isolation Panel [NHANES]    Frequency of Communication with Friends and Family: More than three times a week    Frequency of Social Gatherings with Friends and Family: Once a week    Attends Religious Services: More than 4 times per year    Active Member of [employer] or Organizations: No    Attends Engineer, structural: Not on file    Marital Status: Married  Catering manager Violence: Not on file    Outpatient Medications Prior to Visit  Medication Sig Dispense Refill    Multiple Vitamin (MULTIVITAMIN WITH MINERALS) TABS tablet Take 1 tablet by mouth daily. (Patient not taking: Reported on 11/20/2022)     amoxicillin-clavulanate (AUGMENTIN) 875-125 MG tablet Take 1 tablet by mouth 2 (two) times daily. (Patient not taking: Reported on 03/13/2023) 14 tablet 0   benzonatate (TESSALON) 100 MG capsule Take 1 capsule (100 mg total) by mouth 2 (two) times daily as needed for cough. (Patient not taking: Reported on 03/13/2023) 20 capsule 0   No facility-administered medications prior to visit.    Allergies  Allergen Reactions   Sulfa Antibiotics Hives    ROS Review of Systems  Constitutional:  Negative for chills and fever.  HENT:  Negative for congestion, sinus pressure, sinus pain and sore throat.   Eyes:  Negative for pain and discharge.  Respiratory:  Negative for cough and shortness of breath.   Cardiovascular:  Negative for chest pain and palpitations.  Gastrointestinal:  Negative for abdominal pain, constipation, diarrhea, nausea and vomiting.  Endocrine: Negative for polydipsia and polyuria.  Genitourinary:  Negative for dysuria and hematuria.  Musculoskeletal:  Negative for neck pain and neck stiffness.  Skin:  Negative for rash.  Neurological:  Negative for dizziness and weakness.  Psychiatric/Behavioral:  Negative for agitation and behavioral problems.       Objective:    Physical Exam Vitals reviewed.  Constitutional:      General: She is not in acute distress.    Appearance: She is not diaphoretic.  HENT:     Head: Normocephalic and atraumatic.     Nose: Nose normal.     Mouth/Throat:     Mouth: Mucous membranes are moist.  Eyes:     General: No scleral icterus.    Extraocular Movements: Extraocular movements intact.  Cardiovascular:     Rate and Rhythm: Normal rate and regular rhythm.     Pulses: Normal pulses.     Heart sounds: Normal heart sounds. No murmur heard. Pulmonary:     Breath sounds: Normal breath sounds. No wheezing  or rales.  Abdominal:     Palpations: Abdomen is soft.     Tenderness: There is no abdominal tenderness.  Musculoskeletal:     Cervical back: Neck supple. No tenderness.     Right lower leg: No edema.     Left lower leg: No edema.  Skin:    General: Skin is warm.     Findings: No rash.  Neurological:     General: No focal deficit present.     Mental Status: She is alert and oriented to person, place, and time.     Cranial Nerves: No cranial nerve deficit.     Sensory: No sensory deficit.     Motor: No weakness.  Psychiatric:        Mood and Affect: Mood normal.  Behavior: Behavior normal.     BP 118/84 (BP Location: Right Arm, Patient Position: Sitting, Cuff Size: Normal)   Pulse 75   Ht 5' (1.524 m)   Wt 126 lb 6.4 oz (57.3 kg)   SpO2 95%   BMI 24.69 kg/m  Wt Readings from Last 3 Encounters:  03/13/23 126 lb 6.4 oz (57.3 kg)  11/20/22 120 lb 6.4 oz (54.6 kg)  02/28/22 117 lb 12.8 oz (53.4 kg)    No results found for: "TSH" Lab Results  Component Value Date   WBC 7.6 09/14/2019   HGB 13.4 09/14/2019   HCT 40.3 09/14/2019   MCV 90.6 09/14/2019   PLT 220 09/14/2019   Lab Results  Component Value Date   NA 135 09/14/2019   K 4.0 09/14/2019   CO2 25 09/14/2019   GLUCOSE 100 (H) 09/14/2019   BUN 14 09/14/2019   CREATININE 0.79 09/14/2019   CALCIUM 9.4 09/14/2019   ANIONGAP 11 09/14/2019   No results found for: "CHOL" No results found for: "HDL" No results found for: "LDLCALC" No results found for: "TRIG" No results found for: "CHOLHDL" No results found for: "HGBA1C"    Assessment & Plan:   Problem List Items Addressed This Visit       Digestive   Gastroesophageal reflux disease without esophagitis    Advised to try Pepcid PRN Avoid hot and spicy food        Other   Encounter for general adult medical examination with abnormal findings - Primary    Physical exam as documented. Fasting blood tests reviewed. Flu vaccine today. PAP and  Mammography at Ob/Gyn.      Snoring    STOP-BANG: 2 At  low risk of having sleep apnea Considering her normal BMI and recent stress, advised to wait for now before considering sleep study If she has persistent concern, will refer to sleep specialist - concern for REM sleep disorder if any      Family history of colon cancer    Paternal uncle had colon cancer at age of 17 She is asymptomatic Referred to GI as she is going to turn 76 in 12/24      Relevant Orders   Ambulatory referral to Gastroenterology   Other Visit Diagnoses     Encounter for immunization       Relevant Orders   Flu vaccine trivalent PF, 6mos and older(Flulaval,Afluria,Fluarix,Fluzone) (Completed)       No orders of the defined types were placed in this encounter.   Follow-up: Return in about 1 year (around 03/12/2024) for Annual physical.    Anabel Halon, MD

## 2023-03-13 NOTE — Assessment & Plan Note (Signed)
Paternal uncle had colon cancer at age of 74 She is asymptomatic Referred to GI as she is going to turn 21 in 12/24

## 2023-03-13 NOTE — Assessment & Plan Note (Signed)
STOP-BANG: 2 At  low risk of having sleep apnea Considering her normal BMI and recent stress, advised to wait for now before considering sleep study If she has persistent concern, will refer to sleep specialist - concern for REM sleep disorder if any

## 2023-03-13 NOTE — Patient Instructions (Addendum)
Please contact us if you have persistent concern for snoring.  Okay to take Pepcid 20 mg as needed for acid reflux.

## 2023-03-14 ENCOUNTER — Encounter (INDEPENDENT_AMBULATORY_CARE_PROVIDER_SITE_OTHER): Payer: Self-pay | Admitting: *Deleted

## 2023-05-26 ENCOUNTER — Other Ambulatory Visit (INDEPENDENT_AMBULATORY_CARE_PROVIDER_SITE_OTHER): Payer: No Typology Code available for payment source

## 2023-05-26 ENCOUNTER — Encounter: Payer: Self-pay | Admitting: Orthopedic Surgery

## 2023-05-26 ENCOUNTER — Ambulatory Visit: Payer: No Typology Code available for payment source | Admitting: Orthopedic Surgery

## 2023-05-26 VITALS — BP 125/89 | HR 75 | Ht 60.0 in | Wt 132.0 lb

## 2023-05-26 DIAGNOSIS — M545 Low back pain, unspecified: Secondary | ICD-10-CM

## 2023-05-26 DIAGNOSIS — M4126 Other idiopathic scoliosis, lumbar region: Secondary | ICD-10-CM

## 2023-05-26 DIAGNOSIS — M47816 Spondylosis without myelopathy or radiculopathy, lumbar region: Secondary | ICD-10-CM | POA: Diagnosis not present

## 2023-05-26 MED ORDER — TIZANIDINE HCL 4 MG PO TABS: 4.0000 mg | ORAL_TABLET | Freq: Four times a day (QID) | ORAL | 0 refills | Status: DC | PRN

## 2023-05-26 NOTE — Patient Instructions (Signed)
Physical therapy has been ordered for you at [facility]. They should call you to schedule, [phone] is the phone number to call, if you want to call to schedule.   

## 2023-05-26 NOTE — Progress Notes (Signed)
Office Visit Note   Patient: Mackenzie Delacruz           Date of Birth: [birth year]           MRN: [id number] Visit Date: 05/26/2023 Requested by: Anabel Halon, MD [address] [place],  [place] [address] PCP: Anabel Halon, MD   Assessment & Plan:   Encounter Diagnoses  Name Primary?   Lumbar pain Yes   Facet arthritis of lumbar region    Other idiopathic scoliosis, lumbar region     Meds ordered this encounter  Medications   tiZANidine (ZANAFLEX) 4 MG tablet    Sig: Take 1 tablet (4 mg total) by mouth every 6 (six) hours as needed for muscle spasms.    Dispense:  30 tablet    Refill:  3    47 year old female first time treatment for back pain which is probably related to lumbar scoliosis and facet arthritis  Recommend continue Advil add tizanidine and start physical therapy  If she does not improve MRI would be warranted to tailor further treatment.   Subjective: Chief Complaint  Patient presents with   Back Pain    No Injury, pain in mid left back, started in June  it hurts when sitting and has to re adjust herself Mackenzie Delacruz is taking advil for pain cancer runs in both sides of the family  she has a feeling like something is pulling the nt left buttock     HPI: 47 year old female with longstanding back pain presents with increased pain since June.  Symptoms noted above.  Occasionally she will get some numbness and tingling in both legs but that is a minor occurrence.  Most of her pain is on the left side of the lower back              ROS: Review of Systems  Constitutional:  Negative for chills, fever, malaise/fatigue and weight loss.  Respiratory:  Negative for shortness of breath.   Cardiovascular:  Negative for chest pain.  Gastrointestinal:  Negative for constipation.       Denies loss bowel control   Genitourinary:        Denies urinary retention or los of bladder control   Neurological:  Positive for tingling.      Images personally read and my  interpretation :  SEE REPORT  Visit Diagnoses:  1. Lumbar pain   2. Facet arthritis of lumbar region   3. Other idiopathic scoliosis, lumbar region      Follow-Up Instructions: Return if symptoms worsen or fail to improve.    Objective: Vital Signs: BP 125/89 (Cuff Size: Small)   Pulse 75   Ht 5' (1.524 m)   Wt 132 lb (59.9 kg)   BMI 25.78 kg/m   Physical Exam Vitals and nursing note reviewed.  Constitutional:      Appearance: Normal appearance.  HENT:     Head: Normocephalic and atraumatic.  Eyes:     General: No scleral icterus.       Right eye: No discharge.        Left eye: No discharge.     Extraocular Movements: Extraocular movements intact.     Conjunctiva/sclera: Conjunctivae normal.     Pupils: Pupils are equal, round, and reactive to light.  Cardiovascular:     Rate and Rhythm: Normal rate.     Pulses: Normal pulses.  Musculoskeletal:     Lumbar back: Negative right straight leg raise test and negative left straight leg  raise test.  Skin:    General: Skin is warm and dry.     Capillary Refill: Capillary refill takes less than 2 seconds.  Neurological:     General: No focal deficit present.     Mental Status: She is alert and oriented to person, place, and time.  Psychiatric:        Mood and Affect: Mood normal.        Behavior: Behavior normal.        Thought Content: Thought content normal.        Judgment: Judgment normal.      Back Exam   Tenderness  The patient is experiencing tenderness in the lumbar (Left side).  Range of Motion  The patient has normal back ROM. Back extension: Pain with flexion but she can reach down to her toes.  Back flexion: Pain with 30 degrees extension.  Lateral bend right:  normal  Lateral bend left:  normal Back lateral bend left: Pain with left lateral bend. Rotation right:  normal  Rotation left:  normal   Muscle Strength  Right Quadriceps:  5/5  Left Quadriceps:  5/5   Tests  Straight leg raise right:  negative Straight leg raise left: negative  Reflexes  Patellar:  normal  Other  Gait: normal        Specialty Comments:  No specialty comments available.  Imaging: DG Lumbar Spine 2-3 Views Result Date: 05/26/2023 Imaging report lumbar spine back pain since June or July 2024 Patient has a lumbar scoliosis with the apex at L3 and severe arthritis of the L4-5 facet joint and L5-S1 impression lumbar scoliosis Facet arthritis and lumbar scoliosis     PMFS History: Patient Active Problem List   Diagnosis Date Noted   Snoring 03/13/2023   Family history of colon cancer 03/13/2023   Acute non-recurrent frontal sinusitis 11/20/2022   Right leg pain 02/28/2022   Gastroesophageal reflux disease without esophagitis 02/02/2021   Encounter for general adult medical examination with abnormal findings 02/02/2021   Family history of breast cancer 02/02/2021   Mixed hyperlipidemia 02/02/2021   Past Medical History:  Diagnosis Date   GERD (gastroesophageal reflux disease)     No family history on file.  Past Surgical History:  Procedure Laterality Date   APPENDECTOMY     HYSTEROSCOPY N/A 04/14/2019   Procedure: HYSTEROSCOPY WITH HYDROTHERMAL ABLATION;  Surgeon: Jaymes Graff, MD;  Location: [facility];  Service: Gynecology;  Laterality: N/A;   LAPAROSCOPIC BILATERAL SALPINGECTOMY Bilateral 04/14/2019   Procedure: LAPAROSCOPIC BILATERAL SALPINGECTOMY;  Surgeon: Jaymes Graff, MD;  Location:  SURGERY CENTER;  Service: Gynecology;  Laterality: Bilateral;   Social History   Occupational History   Not on file  Tobacco Use   Smoking status: Former    Current packs/day: 0.00    Types: Cigarettes    Quit date: 08/01/2004    Years since quitting: 18.8   Smokeless tobacco: Never  Vaping Use   Vaping status: Never Used  Substance and Sexual Activity   Alcohol use: Not Currently   Drug use: Never   Sexual activity: Yes

## 2023-05-30 NOTE — Therapy (Incomplete)
OUTPATIENT PHYSICAL THERAPY THORACOLUMBAR EVALUATION   Patient Name: Mackenzie Delacruz MRN: [id number] DOB:02-07-1976, 47 y.o., female Today's Date: 06/02/2023  END OF SESSION:  PT End of Session - 06/02/23 1434     Visit Number 1    Number of Visits 8    Date for PT Re-Evaluation 06/30/23    Authorization Type [employer]/ [employer]    PT Start Time 1435    PT Stop Time 1515    PT Time Calculation (min) 40 min    Activity Tolerance Patient tolerated treatment well    Behavior During Therapy WFL for tasks assessed/performed             Past Medical History:  Diagnosis Date   GERD (gastroesophageal reflux disease)    Past Surgical History:  Procedure Laterality Date   APPENDECTOMY     HYSTEROSCOPY N/A 04/14/2019   Procedure: HYSTEROSCOPY WITH HYDROTHERMAL ABLATION;  Surgeon: Jaymes Graff, MD;  Location: [facility];  Service: Gynecology;  Laterality: N/A;   LAPAROSCOPIC BILATERAL SALPINGECTOMY Bilateral 04/14/2019   Procedure: LAPAROSCOPIC BILATERAL SALPINGECTOMY;  Surgeon: Jaymes Graff, MD;  Location: [facility];  Service: Gynecology;  Laterality: Bilateral;   Patient Active Problem List   Diagnosis Date Noted   Snoring 03/13/2023   Family history of colon cancer 03/13/2023   Acute non-recurrent frontal sinusitis 11/20/2022   Right leg pain 02/28/2022   Gastroesophageal reflux disease without esophagitis 02/02/2021   Encounter for general adult medical examination with abnormal findings 02/02/2021   Family history of breast cancer 02/02/2021   Mixed hyperlipidemia 02/02/2021    PCP: Trena Platt, MD  REFERRING PROVIDER: Vickki Hearing, MD  REFERRING DIAG: M54.50 (ICD-10-CM) - Lumbar pain M47.816 (ICD-10-CM) - Facet arthritis of lumbar region M41.26 (ICD-10-CM) - Other idiopathic scoliosis, lumbar region  Rationale for Evaluation and Treatment: Rehabilitation  THERAPY DIAG:  Low back pain, unspecified back pain  laterality, unspecified chronicity, unspecified whether sciatica present  ONSET DATE: since the summer; 6 months  SUBJECTIVE:                                                                                                                                                                                           SUBJECTIVE STATEMENT: This started over the summer; insidious onset; works from home so has gained weight and thinks maybe that's why.  Has an 45 year old.  Went to see Dr. Romeo Apple; has scoliosis and then arthritis  in lower spine; has low back pain in middle and left side; sometime left glute numbness.  PERTINENT HISTORY:  Scoliosis Mother has osteopenia Mom recently diagnosed with cancer  PAIN:  Are you having pain? Yes: NPRS scale: 8/10 Pain location: left  and mid back and sometime left glute is numb Pain description: aching Aggravating factors: sitting for a long time makes It worse Relieving factors: get up and move  PRECAUTIONS: None  RED FLAGS: None   WEIGHT BEARING RESTRICTIONS: No  FALLS:  Has patient fallen in last 6 months? No  OCCUPATION: work from home mostly sitting  PLOF: Independent  PATIENT GOALS: get rid of the pain  NEXT MD VISIT: PRN when done with therapy  OBJECTIVE:  Note: Objective measures were completed at Evaluation unless otherwise noted.  DIAGNOSTIC FINDINGS:  Imaging report lumbar spine back pain since June or July 2024   Patient has a lumbar scoliosis with the apex at L3 and severe arthritis of the L4-5 facet joint and L5-S1 impression lumbar scoliosis   Facet arthritis and lumbar scoliosis  PATIENT SURVEYS:  Modified Oswestry 10/50 20% disability   COGNITION: Overall cognitive status: Within functional limits for tasks assessed     SENSATION: Complains of some numbness left glute  MUSCLE LENGTH: Hamstrings: Right ~45 deg ; Left ~ 45 deg  POSTURE: increased lumbar lordosis and right pelvic  obliquity  PALPATION: Right hip elevated > left  LUMBAR ROM:   AROM eval  Flexion To ankle 90% available  Extension 30% available*  Right lateral flexion To 3" above knee joint line  Left lateral flexion To 1.5 " above knee joint line  Right rotation   Left rotation    (Blank rows = not tested)  LOWER EXTREMITY ROM:     Active  Right eval Left eval  Hip flexion    Hip extension    Hip abduction    Hip adduction    Hip internal rotation    Hip external rotation    Knee flexion    Knee extension    Ankle dorsiflexion    Ankle plantarflexion    Ankle inversion    Ankle eversion     (Blank rows = not tested)  LOWER EXTREMITY MMT:    MMT Right eval Left eval  Hip flexion 5 4-  Hip extension 4 3+  Hip abduction    Hip adduction    Hip internal rotation    Hip external rotation    Knee flexion 5 4  Knee extension 5 4  Ankle dorsiflexion 5 4-  Ankle plantarflexion    Ankle inversion    Ankle eversion     (Blank rows = not tested)  LUMBAR SPECIAL TESTS:  Straight leg raise test: Negative  FUNCTIONAL TESTS:  5 times sit to stand: 14.38 sec no UE assist  GAIT: Distance walked: 50 ft in clinic Assistive device utilized: None Level of assistance: Modified independence Comments: slight decreased gait speed  TREATMENT DATE:  06/02/2023 physical therapy evaluation and HEP instruction  PATIENT EDUCATION:  Education details: Patient educated on exam findings, POC, scope of PT, HEP, and what to expect next visit. Person educated: Patient Education method: Explanation, Demonstration, and Handouts Education comprehension: verbalized understanding, returned demonstration, verbal cues required, and tactile cues required  HOME EXERCISE PROGRAM: Decompression exercises 1-5  ASSESSMENT:  CLINICAL IMPRESSION: Patient is a 47 y.o. female  who was seen today for physical therapy evaluation and treatment for M54.50 (ICD-10-CM) - Lumbar pain M47.816 (ICD-10-CM) - Facet arthritis of lumbar region M41.26 (ICD-10-CM) - Other idiopathic scoliosis, lumbar region. Patient demonstrates muscle weakness, reduced ROM, and fascial restrictions which are likely contributing to symptoms of pain and are negatively impacting patient ability to perform ADLs and functional mobility tasks. Patient will benefit from skilled physical therapy services to address these deficits to reduce pain and improve level of function with ADLs and functional mobility tasks.   OBJECTIVE IMPAIRMENTS: Abnormal gait, decreased activity tolerance, decreased knowledge of condition, decreased mobility, decreased ROM, decreased strength, hypomobility, increased fascial restrictions, impaired perceived functional ability, impaired flexibility, postural dysfunction, and pain.   ACTIVITY LIMITATIONS: lifting, bending, sitting, squatting, dressing, and caring for others  PARTICIPATION LIMITATIONS: meal prep, cleaning, laundry, driving, and occupation  PERSONAL FACTORS: Profession are also affecting patient's functional outcome.   REHAB POTENTIAL: Good  CLINICAL DECISION MAKING: Stable/uncomplicated  EVALUATION COMPLEXITY: Low   GOALS: Goals reviewed with patient? No  SHORT TERM GOALS: Target date: 06/16/2023  patient will be independent with initial HEP  Baseline: Goal status: INITIAL  2.  Patient will self report 30% improvement to improve tolerance for functional activity  Baseline:  Goal status: INITIAL   LONG TERM GOALS: Target date: 06/30/2023  Patient will be independent in self management strategies to improve quality of life and functional outcomes.  Baseline:  Goal status: INITIAL  2.  Patient will self report 50% improvement to improve tolerance for functional activity  Baseline:  Goal status: INITIAL  3.  Patient will increase bilateral  leg  MMTs to 5/5 without pain to promote return to ambulation community distances with minimal deviation.  Baseline: see above Goal status: INITIAL  4.  Patient will improve lumbar AROM to wfl throughout without pain to improve ability to dress without hinderance. Baseline: see above Goal status: INITIAL  5.  Patient will improve modified Oswestry score to 10% or less to demonstrate improved perceived functional mobility Baseline:  Goal status: INITIAL  6.  Patient will be able to sit for work x 1 hour without pain > 2/10 in her low back to increase work efficiency Baseline:  Goal status: INITIAL  PLAN:  PT FREQUENCY: 2x/week  PT DURATION: 4 weeks  PLANNED INTERVENTIONS: 97164- PT Re-evaluation, 97110-Therapeutic exercises, 97530- Therapeutic activity, 97112- Neuromuscular re-education, 97535- Self Care, 63875- Manual therapy, [phone]- Gait training, [phone]- Orthotic Fit/training, [phone]- Canalith repositioning, [id number]- Aquatic Therapy, [phone]- Splinting, Patient/Family education, Balance training, Stair training, Taping, Dry Needling, Joint mobilization, Joint manipulation, Spinal manipulation, Spinal mobilization, Scar mobilization, and DME instructions. Marland Kitchen  PLAN FOR NEXT SESSION: Review HEP and goals; address lumbar mobility, core and postural strength; scoliosis; leg strength. Bridge   3:20 PM, 06/02/23 [place] Leinen Small Christabel Camire MPT [facility] [place] [phone]

## 2023-06-02 ENCOUNTER — Other Ambulatory Visit: Payer: Self-pay

## 2023-06-02 ENCOUNTER — Telehealth (INDEPENDENT_AMBULATORY_CARE_PROVIDER_SITE_OTHER): Payer: Self-pay | Admitting: Gastroenterology

## 2023-06-02 ENCOUNTER — Ambulatory Visit (HOSPITAL_COMMUNITY): Payer: No Typology Code available for payment source | Attending: Orthopedic Surgery

## 2023-06-02 DIAGNOSIS — M47816 Spondylosis without myelopathy or radiculopathy, lumbar region: Secondary | ICD-10-CM | POA: Diagnosis not present

## 2023-06-02 DIAGNOSIS — M4126 Other idiopathic scoliosis, lumbar region: Secondary | ICD-10-CM | POA: Diagnosis not present

## 2023-06-02 DIAGNOSIS — M545 Low back pain, unspecified: Secondary | ICD-10-CM | POA: Diagnosis present

## 2023-06-02 DIAGNOSIS — Z1211 Encounter for screening for malignant neoplasm of colon: Secondary | ICD-10-CM

## 2023-06-02 NOTE — Telephone Encounter (Signed)
Who is your primary care physician: Dr.Patel  Reasons for the colonoscopy: screening  Have you had a colonoscopy before?  no  Do you have family history of colon cancer? yes  Previous colonoscopy with polyps removed? no  Do you have a history colorectal cancer?   no  Are you diabetic? If yes, Type 1 or Type 2?    no  Do you have a prosthetic or mechanical heart valve? no  Do you have a pacemaker/defibrillator?   no  Have you had endocarditis/atrial fibrillation? no  Have you had joint replacement within the last 12 months?  no  Do you tend to be constipated or have to use laxatives? yes  Do you have any history of drugs or alchohol?  Yes alcohol occasionally   Do you use supplemental oxygen?  no  Have you had a stroke or heart attack within the last 6 months? no  Do you take weight loss medication?  no  For female patients: have you had a hysterectomy?  no                                     are you post menopausal?       no                                            do you still have your menstrual cycle? yes      Do you take any blood-thinning medications such as: (aspirin, warfarin, Plavix, Aggrenox)  no  If yes we need the name, milligram, dosage and who is prescribing doctor  Current Outpatient Medications on File Prior to Visit  Medication Sig Dispense Refill   Multiple Vitamin (MULTIVITAMIN WITH MINERALS) TABS tablet Take 1 tablet by mouth daily. (Patient not taking: Reported on 06/02/2023)     tiZANidine (ZANAFLEX) 4 MG tablet Take 1 tablet (4 mg total) by mouth every 6 (six) hours as needed for muscle spasms. (Patient not taking: Reported on 06/02/2023) 30 tablet 0   No current facility-administered medications on file prior to visit.    Allergies  Allergen Reactions   Sulfa Antibiotics Hives     Pharmacy: [facility]   Primary Insurance Name: [employer] number where you can be reached: [phone]

## 2023-06-03 NOTE — Telephone Encounter (Signed)
Room 1 Thanks

## 2023-06-03 NOTE — Telephone Encounter (Signed)
Pt contacted and would like to be scheduled in Feb.  (Monday or Friday early morning)

## 2023-06-06 ENCOUNTER — Ambulatory Visit (HOSPITAL_COMMUNITY): Payer: No Typology Code available for payment source | Attending: Orthopedic Surgery | Admitting: Physical Therapy

## 2023-06-06 DIAGNOSIS — M545 Low back pain, unspecified: Secondary | ICD-10-CM | POA: Diagnosis present

## 2023-06-06 NOTE — Therapy (Addendum)
 OUTPATIENT PHYSICAL THERAPY THORACOLUMBAR Treatment    Patient Name: Mackenzie Delacruz MRN: [id number] DOB:27-Apr-1976, 48 y.o., female Today's Date: 06/06/2023  END OF SESSION:  PT End of Session - 06/06/23 1621     Visit Number 2    Number of Visits 8    Date for PT Re-Evaluation 06/30/23    Authorization Type [employer]/ [place] Preferred    PT Start Time 1538    PT Stop Time 1622    PT Time Calculation (min) 44 min    Activity Tolerance Patient tolerated treatment well    Behavior During Therapy WFL for tasks assessed/performed              Past Medical History:  Diagnosis Date   GERD (gastroesophageal reflux disease)    Past Surgical History:  Procedure Laterality Date   APPENDECTOMY     HYSTEROSCOPY N/A 04/14/2019   Procedure: HYSTEROSCOPY WITH HYDROTHERMAL ABLATION;  Surgeon: Armond Cape, MD;  Location: [facility];  Service: Gynecology;  Laterality: N/A;   LAPAROSCOPIC BILATERAL SALPINGECTOMY Bilateral 04/14/2019   Procedure: LAPAROSCOPIC BILATERAL SALPINGECTOMY;  Surgeon: Armond Cape, MD;  Location: [facility];  Service: Gynecology;  Laterality: Bilateral;   Patient Active Problem List   Diagnosis Date Noted   Snoring 03/13/2023   Family history of colon cancer 03/13/2023   Acute non-recurrent frontal sinusitis 11/20/2022   Right leg pain 02/28/2022   Gastroesophageal reflux disease without esophagitis 02/02/2021   Encounter for general adult medical examination with abnormal findings 02/02/2021   Family history of breast cancer 02/02/2021   Mixed hyperlipidemia 02/02/2021    PCP: Suzzane Blanch, MD  REFERRING PROVIDER: Margrette Taft BRAVO, MD  REFERRING DIAG: M54.50 (ICD-10-CM) - Lumbar pain M47.816 (ICD-10-CM) - Facet arthritis of lumbar region M41.26 (ICD-10-CM) - Other idiopathic scoliosis, lumbar region  Rationale for Evaluation and Treatment: Rehabilitation  THERAPY DIAG:  Low back pain, unspecified back pain  laterality, unspecified chronicity, unspecified whether sciatica present  ONSET DATE: since the summer; 6 months  SUBJECTIVE:                                                                                                                                                                                           SUBJECTIVE STATEMENT:  Pt states that she is completing her exercises.  PERTINENT HISTORY:  Scoliosis Mother has osteopenia Mom recently diagnosed with cancer  PAIN:  Are you having pain? Yes: NPRS scale: 8/10 Pain location: left  and mid back and sometime left glute is numb Pain description: aching Aggravating factors: sitting for a long time makes It worse Relieving factors: get up and move  PRECAUTIONS: None  RED FLAGS: None   WEIGHT BEARING RESTRICTIONS: No  FALLS:  Has patient fallen in last 6 months? No  OCCUPATION: work from home mostly sitting  PLOF: Independent  PATIENT GOALS: get rid of the pain  NEXT MD VISIT: PRN when done with therapy  OBJECTIVE:  Note: Objective measures were completed at Evaluation unless otherwise noted.  DIAGNOSTIC FINDINGS:  Imaging report lumbar spine back pain since June or July 2024   Patient has a lumbar scoliosis with the apex at L3 and severe arthritis of the L4-5 facet joint and L5-S1 impression lumbar scoliosis   Facet arthritis and lumbar scoliosis  PATIENT SURVEYS:  Modified Oswestry 10/50 20% disability   COGNITION: Overall cognitive status: Within functional limits for tasks assessed     SENSATION: Complains of some numbness left glute  MUSCLE LENGTH: Hamstrings: Right ~45 deg ; Left ~ 45 deg  POSTURE: increased lumbar lordosis and right pelvic obliquity  PALPATION: Right hip elevated > left  LUMBAR ROM:   AROM eval  Flexion To ankle 90% available  Extension 30% available*  Right lateral flexion To 3 above knee joint line  Left lateral flexion To 1.5  above knee joint line  Right rotation    Left rotation    (Blank rows = not tested)  LOWER EXTREMITY ROM:     Active  Right eval Left eval  Hip flexion    Hip extension    Hip abduction    Hip adduction    Hip internal rotation    Hip external rotation    Knee flexion    Knee extension    Ankle dorsiflexion    Ankle plantarflexion    Ankle inversion    Ankle eversion     (Blank rows = not tested)  LOWER EXTREMITY MMT:    MMT Right eval Left eval  Hip flexion 5 4-  Hip extension 4 3+  Hip abduction    Hip adduction    Hip internal rotation    Hip external rotation    Knee flexion 5 4  Knee extension 5 4  Ankle dorsiflexion 5 4-  Ankle plantarflexion    Ankle inversion    Ankle eversion     (Blank rows = not tested)  LUMBAR SPECIAL TESTS:  Straight leg raise test: Negative  FUNCTIONAL TESTS:  5 times sit to stand: 14.38 sec no UE assist  GAIT: Distance walked: 50 ft in clinic Assistive device utilized: None Level of assistance: Modified independence Comments: slight decreased gait speed  TREATMENT DATE:  06/05/22: Decompression 1-5 Wall arch x 10 Hip excursion x 3 Knee to chest x 3 Supine active hamstring stretch x 3 Bridge x 10 Abdominal set x 10 Bend knee raise x 10  Clam x 10      06/02/2023 physical therapy evaluation and HEP instruction  PATIENT EDUCATION:  Education details: Patient educated on exam findings, POC, scope of PT, HEP, and what to expect next visit. Person educated: Patient Education method: Explanation, Demonstration, and Handouts Education comprehension: verbalized understanding, returned demonstration, verbal cues required, and tactile cues required  HOME EXERCISE PROGRAM: Decompression exercises 1-5 1/Access Code: [email or web address] URL: [email or web address] Date: 06/06/2023 Prepared by: Montie Metro  Exercises - Supine  Single Knee to Chest Stretch  - 2 x daily - 7 x weekly - 1 sets - 3 reps - 30 hold - Supine Hamstring Stretch  - 2 x daily - 7 x weekly - 1 sets - 3 reps - 30 hold - Beginner Bridge  - 2 x daily - 7 x weekly - 1 sets - 10 reps - 5 hold3/25  ASSESSMENT:  CLINICAL IMPRESSION: Reviewed evaluation and goals with pt.  Progressed pt HEP to include both strength and stability exercises.   Patient continues to demonstrates muscle weakness, reduced ROM, and fascial restrictions which are likely contributing to symptoms of pain and are negatively impacting patient ability to perform ADLs and functional mobility tasks. Patient will benefit from skilled physical therapy services to address these deficits to reduce pain and improve level of function with ADLs and functional mobility tasks.   OBJECTIVE IMPAIRMENTS: Abnormal gait, decreased activity tolerance, decreased knowledge of condition, decreased mobility, decreased ROM, decreased strength, hypomobility, increased fascial restrictions, impaired perceived functional ability, impaired flexibility, postural dysfunction, and pain.   ACTIVITY LIMITATIONS: lifting, bending, sitting, squatting, dressing, and caring for others  PARTICIPATION LIMITATIONS: meal prep, cleaning, laundry, driving, and occupation  PERSONAL FACTORS: Profession are also affecting patient's functional outcome.   REHAB POTENTIAL: Good  CLINICAL DECISION MAKING: Stable/uncomplicated  EVALUATION COMPLEXITY: Low   GOALS: Goals reviewed with patient? No  SHORT TERM GOALS: Target date: 06/16/2023  patient will be independent with initial HEP  Baseline: Goal status: on-going  2.  Patient will self report 30% improvement to improve tolerance for functional activity  Baseline:  Goal status: on-going   LONG TERM GOALS: Target date: 06/30/2023  Patient will be independent in self management strategies to improve quality of life and functional outcomes.  Baseline:  Goal  status: on-going  2.  Patient will self report 50% improvement to improve tolerance for functional activity  Baseline:  Goal status: on-going  3.  Patient will increase bilateral  leg MMTs to 5/5 without pain to promote return to ambulation community distances with minimal deviation.  Baseline: see above Goal status: on-going  4.  Patient will improve lumbar AROM to wfl throughout without pain to improve ability to dress without hinderance. Baseline: see above Goal status: on-going  5.  Patient will improve modified Oswestry score to 10% or less to demonstrate improved perceived functional mobility Baseline:  Goal status:on-going  6.  Patient will be able to sit for work x 1 hour without pain > 2/10 in her low back to increase work efficiency Baseline:  Goal status: on-going PLAN:  PT FREQUENCY: 2x/week  PT DURATION: 4 weeks  PLANNED INTERVENTIONS: 97164- PT Re-evaluation, 97110-Therapeutic exercises, 97530- Therapeutic activity, 97112- Neuromuscular re-education, 97535- Self Care, 02859- Manual therapy, [phone]- Gait training, [phone]- Orthotic Fit/training, [phone]- Canalith repositioning, [id number]- Aquatic Therapy, [phone]- Splinting, Patient/Family education, Balance training, Stair training, Taping, Dry Needling, Joint mobilization, Joint manipulation, Spinal manipulation, Spinal mobilization, Scar mobilization, and DME instructions. SABRA  PLAN FOR NEXT SESSION: Review HEP and goals; address lumbar mobility, core and postural strength; scoliosis; leg strength. Bridge   4:22 PM, 06/06/23 Montie  Nelwyn, PT CLT [phone]

## 2023-06-10 ENCOUNTER — Encounter (HOSPITAL_COMMUNITY): Payer: Self-pay

## 2023-06-10 ENCOUNTER — Ambulatory Visit (HOSPITAL_COMMUNITY): Payer: No Typology Code available for payment source

## 2023-06-10 ENCOUNTER — Encounter (HOSPITAL_COMMUNITY): Payer: No Typology Code available for payment source

## 2023-06-10 DIAGNOSIS — M545 Low back pain, unspecified: Secondary | ICD-10-CM

## 2023-06-10 NOTE — Therapy (Signed)
 OUTPATIENT PHYSICAL THERAPY THORACOLUMBAR Treatment    Patient Name: Mackenzie Delacruz MRN: [id number] DOB:04/01/1976, 48 y.o., female Today's Date: 06/10/2023  END OF SESSION:  PT End of Session - 06/10/23 1523     Visit Number 3    Number of Visits 8    Date for PT Re-Evaluation 06/30/23    Authorization Type [employer]/ [place] Preferred    PT Start Time 1526   late sign in   PT Stop Time 1600    PT Time Calculation (min) 34 min    Activity Tolerance Patient tolerated treatment well    Behavior During Therapy WFL for tasks assessed/performed              Past Medical History:  Diagnosis Date   GERD (gastroesophageal reflux disease)    Past Surgical History:  Procedure Laterality Date   APPENDECTOMY     HYSTEROSCOPY N/A 04/14/2019   Procedure: HYSTEROSCOPY WITH HYDROTHERMAL ABLATION;  Surgeon: Armond Cape, MD;  Location: [facility];  Service: Gynecology;  Laterality: N/A;   LAPAROSCOPIC BILATERAL SALPINGECTOMY Bilateral 04/14/2019   Procedure: LAPAROSCOPIC BILATERAL SALPINGECTOMY;  Surgeon: Armond Cape, MD;  Location: [facility];  Service: Gynecology;  Laterality: Bilateral;   Patient Active Problem List   Diagnosis Date Noted   Snoring 03/13/2023   Family history of colon cancer 03/13/2023   Acute non-recurrent frontal sinusitis 11/20/2022   Right leg pain 02/28/2022   Gastroesophageal reflux disease without esophagitis 02/02/2021   Encounter for general adult medical examination with abnormal findings 02/02/2021   Family history of breast cancer 02/02/2021   Mixed hyperlipidemia 02/02/2021    PCP: Suzzane Blanch, MD  REFERRING PROVIDER: Margrette Taft BRAVO, MD  REFERRING DIAG: M54.50 (ICD-10-CM) - Lumbar pain M47.816 (ICD-10-CM) - Facet arthritis of lumbar region M41.26 (ICD-10-CM) - Other idiopathic scoliosis, lumbar region  Rationale for Evaluation and Treatment: Rehabilitation  THERAPY DIAG:  Low back pain, unspecified  back pain laterality, unspecified chronicity, unspecified whether sciatica present  ONSET DATE: since the summer; 6 months  SUBJECTIVE:                                                                                                                                                                                           SUBJECTIVE STATEMENT:  Pt states that she is completing her exercises.  Reports she has reduced HEP compliance due to son's birthday with increased pain.  Current pain scale 6-7/10 LBP.  Has purchased lumbar support to assist with sitting during pain.  Has adjusted her desk  PERTINENT HISTORY:  Scoliosis Mother has osteopenia Mom recently diagnosed with cancer  PAIN:  Are  you having pain? Yes: NPRS scale: 8/10 Pain location: left  and mid back and sometime left glute is numb Pain description: aching Aggravating factors: sitting for a long time makes It worse Relieving factors: get up and move  PRECAUTIONS: None  RED FLAGS: None   WEIGHT BEARING RESTRICTIONS: No  FALLS:  Has patient fallen in last 6 months? No  OCCUPATION: work from home mostly sitting  PLOF: Independent  PATIENT GOALS: get rid of the pain  NEXT MD VISIT: PRN when done with therapy  OBJECTIVE:  Note: Objective measures were completed at Evaluation unless otherwise noted.  DIAGNOSTIC FINDINGS:  Imaging report lumbar spine back pain since June or July 2024   Patient has a lumbar scoliosis with the apex at L3 and severe arthritis of the L4-5 facet joint and L5-S1 impression lumbar scoliosis   Facet arthritis and lumbar scoliosis  PATIENT SURVEYS:  Modified Oswestry 10/50 20% disability   COGNITION: Overall cognitive status: Within functional limits for tasks assessed     SENSATION: Complains of some numbness left glute  MUSCLE LENGTH: Hamstrings: Right ~45 deg ; Left ~ 45 deg  POSTURE: increased lumbar lordosis and right pelvic obliquity  PALPATION: Right hip elevated >  left  LUMBAR ROM:   AROM eval  Flexion To ankle 90% available  Extension 30% available*  Right lateral flexion To 3 above knee joint line  Left lateral flexion To 1.5  above knee joint line  Right rotation   Left rotation    (Blank rows = not tested)  LOWER EXTREMITY ROM:     Active  Right eval Left eval  Hip flexion    Hip extension    Hip abduction    Hip adduction    Hip internal rotation    Hip external rotation    Knee flexion    Knee extension    Ankle dorsiflexion    Ankle plantarflexion    Ankle inversion    Ankle eversion     (Blank rows = not tested)  LOWER EXTREMITY MMT:    MMT Right eval Left eval  Hip flexion 5 4-  Hip extension 4 3+  Hip abduction    Hip adduction    Hip internal rotation    Hip external rotation    Knee flexion 5 4  Knee extension 5 4  Ankle dorsiflexion 5 4-  Ankle plantarflexion    Ankle inversion    Ankle eversion     (Blank rows = not tested)  LUMBAR SPECIAL TESTS:  Straight leg raise test: Negative  FUNCTIONAL TESTS:  5 times sit to stand: 14.38 sec no UE assist  GAIT: Distance walked: 50 ft in clinic Assistive device utilized: None Level of assistance: Modified independence Comments: slight decreased gait speed  TREATMENT DATE:  06/10/23: Supine: Decompression with RTB 1-4 5reps Posterior pelvic tilt  March with UE opposite LE with ab set 10x 3-5 Bridge 10x DKTC 2x 30  Sidelying: Clam with RTB 5x 5  05/06/23: Decompression 1-5 Wall arch x 10 Hip excursion x 3 Knee to chest x 3 Supine active hamstring stretch x 3 Bridge x 10 Abdominal set x 10 Bend knee raise x 10  Clam x 10      06/02/2023 physical therapy evaluation and HEP instruction  PATIENT EDUCATION:  Education details: Patient educated on exam findings, POC, scope of PT, HEP, and what to expect next  visit. Person educated: Patient Education method: Explanation, Demonstration, and Handouts Education comprehension: verbalized understanding, returned demonstration, verbal cues required, and tactile cues required  HOME EXERCISE PROGRAM: 06/10/23:  decompression exercises with RTB  Decompression exercises 1-5 1/Access Code: [email or web address] URL: [email or web address] Date: 06/06/2023 Prepared by: Montie Metro  Exercises - Supine Single Knee to Chest Stretch  - 2 x daily - 7 x weekly - 1 sets - 3 reps - 30 hold - Supine Hamstring Stretch  - 2 x daily - 7 x weekly - 1 sets - 3 reps - 30 hold - Beginner Bridge  - 2 x daily - 7 x weekly - 1 sets - 10 reps - 5 hold3/25  ASSESSMENT:  CLINICAL IMPRESSION: Session focus on core/proximal strengthening and stability.  Added theraband with decompression exercises that was tolerated well.  Pt did require intermittent cueing for core stability during supine exercises.  EOS reports pain reduced to 4/10.  Added theraband to HEP.     OBJECTIVE IMPAIRMENTS: Abnormal gait, decreased activity tolerance, decreased knowledge of condition, decreased mobility, decreased ROM, decreased strength, hypomobility, increased fascial restrictions, impaired perceived functional ability, impaired flexibility, postural dysfunction, and pain.   ACTIVITY LIMITATIONS: lifting, bending, sitting, squatting, dressing, and caring for others  PARTICIPATION LIMITATIONS: meal prep, cleaning, laundry, driving, and occupation  PERSONAL FACTORS: Profession are also affecting patient's functional outcome.   REHAB POTENTIAL: Good  CLINICAL DECISION MAKING: Stable/uncomplicated  EVALUATION COMPLEXITY: Low   GOALS: Goals reviewed with patient? No  SHORT TERM GOALS: Target date: 06/16/2023  patient will be independent with initial HEP  Baseline: Goal status: on-going  2.  Patient will self report 30% improvement to improve tolerance for functional  activity  Baseline:  Goal status: on-going   LONG TERM GOALS: Target date: 06/30/2023  Patient will be independent in self management strategies to improve quality of life and functional outcomes.  Baseline:  Goal status: on-going  2.  Patient will self report 50% improvement to improve tolerance for functional activity  Baseline:  Goal status: on-going  3.  Patient will increase bilateral  leg MMTs to 5/5 without pain to promote return to ambulation community distances with minimal deviation.  Baseline: see above Goal status: on-going  4.  Patient will improve lumbar AROM to wfl throughout without pain to improve ability to dress without hinderance. Baseline: see above Goal status: on-going  5.  Patient will improve modified Oswestry score to 10% or less to demonstrate improved perceived functional mobility Baseline:  Goal status:on-going  6.  Patient will be able to sit for work x 1 hour without pain > 2/10 in her low back to increase work efficiency Baseline:  Goal status: on-going PLAN:  PT FREQUENCY: 2x/week  PT DURATION: 4 weeks  PLANNED INTERVENTIONS: 97164- PT Re-evaluation, 97110-Therapeutic exercises, 97530- Therapeutic activity, 97112- Neuromuscular re-education, 97535- Self Care, 02859- Manual therapy, [phone]- Gait training, [phone]- Orthotic Fit/training, [phone]- Canalith repositioning, [id number]- Aquatic Therapy, [phone]- Splinting, Patient/Family education, Balance training, Stair training, Taping, Dry Needling, Joint mobilization, Joint manipulation, Spinal manipulation, Spinal mobilization, Scar mobilization, and DME instructions. SABRA  PLAN FOR NEXT SESSION:  address lumbar mobility, core and postural strength; scoliosis; leg strength. [address]  [place], LPTA/CLT; CBIS [phone]  Renae Augustin Amble, PTA 06/10/2023, 4:08 PM  4:08 PM, 06/10/23

## 2023-06-11 NOTE — Telephone Encounter (Signed)
 Pt contacted. Pt needing Monday or Friday. Dr.Ahmed has 2/17 (Monday) and pt would like to be scheduled on that day. Ok to be scheduled with Ahmed since triage sent to you. Please advise. Thank you

## 2023-06-12 ENCOUNTER — Encounter (HOSPITAL_COMMUNITY): Payer: No Typology Code available for payment source

## 2023-06-12 MED ORDER — PEG 3350-KCL-NA BICARB-NACL 420 G PO SOLR: 4000.0000 mL | Freq: Once | ORAL | 0 refills | Status: AC

## 2023-06-12 NOTE — Addendum Note (Signed)
 Addended by: Marlowe Shores on: 06/12/2023 09:14 AM   Modules accepted: Orders

## 2023-06-12 NOTE — Telephone Encounter (Signed)
 Pt scheduled for 07/21/23. Instructions printed and will be mailed to pt. Prep sent to pharmacy

## 2023-06-12 NOTE — Telephone Encounter (Signed)
No pa needed per insurance

## 2023-06-13 ENCOUNTER — Encounter (HOSPITAL_COMMUNITY): Payer: No Typology Code available for payment source

## 2023-06-13 ENCOUNTER — Encounter (INDEPENDENT_AMBULATORY_CARE_PROVIDER_SITE_OTHER): Payer: Self-pay | Admitting: *Deleted

## 2023-06-13 ENCOUNTER — Encounter (HOSPITAL_COMMUNITY): Payer: Self-pay

## 2023-06-13 NOTE — Telephone Encounter (Signed)
 Referral completed, [employer] apt letter sent to PCP

## 2023-06-16 ENCOUNTER — Ambulatory Visit (HOSPITAL_COMMUNITY): Payer: No Typology Code available for payment source

## 2023-06-17 ENCOUNTER — Encounter (HOSPITAL_COMMUNITY): Payer: No Typology Code available for payment source

## 2023-06-20 ENCOUNTER — Ambulatory Visit (HOSPITAL_COMMUNITY): Payer: No Typology Code available for payment source

## 2023-06-20 DIAGNOSIS — M545 Low back pain, unspecified: Secondary | ICD-10-CM | POA: Diagnosis not present

## 2023-06-20 NOTE — Therapy (Signed)
OUTPATIENT PHYSICAL THERAPY THORACOLUMBAR Treatment    Patient Name: Mackenzie Delacruz MRN: [id number] DOB:[birth year], 48 y.o., female Today's Date: 06/20/2023  END OF SESSION:  PT End of Session - 06/20/23 1652     Visit Number 4    Number of Visits 8    Date for PT Re-Evaluation 06/30/23    Authorization Type [employer]/ [employer] Preferred    PT Start Time 1605    PT Stop Time 1645    PT Time Calculation (min) 40 min    Activity Tolerance Patient tolerated treatment well    Behavior During Therapy WFL for tasks assessed/performed            Past Medical History:  Diagnosis Date   GERD (gastroesophageal reflux disease)    Past Surgical History:  Procedure Laterality Date   APPENDECTOMY     HYSTEROSCOPY N/A 04/14/2019   Procedure: HYSTEROSCOPY WITH HYDROTHERMAL ABLATION;  Surgeon: Jaymes Graff, MD;  Location: [facility];  Service: Gynecology;  Laterality: N/A;   LAPAROSCOPIC BILATERAL SALPINGECTOMY Bilateral 04/14/2019   Procedure: LAPAROSCOPIC BILATERAL SALPINGECTOMY;  Surgeon: Jaymes Graff, MD;  Location:  SURGERY CENTER;  Service: Gynecology;  Laterality: Bilateral;   Patient Active Problem List   Diagnosis Date Noted   Snoring 03/13/2023   Family history of colon cancer 03/13/2023   Acute non-recurrent frontal sinusitis 11/20/2022   Right leg pain 02/28/2022   Gastroesophageal reflux disease without esophagitis 02/02/2021   Encounter for general adult medical examination with abnormal findings 02/02/2021   Family history of breast cancer 02/02/2021   Mixed hyperlipidemia 02/02/2021    PCP: Trena Platt, MD  REFERRING PROVIDER: Vickki Hearing, MD  REFERRING DIAG: M54.50 (ICD-10-CM) - Lumbar pain M47.816 (ICD-10-CM) - Facet arthritis of lumbar region M41.26 (ICD-10-CM) - Other idiopathic scoliosis, lumbar region  Rationale for Evaluation and Treatment: Rehabilitation  THERAPY DIAG:  Low back pain, unspecified back pain  laterality, unspecified chronicity, unspecified whether sciatica present  ONSET DATE: since the summer; 6 months  SUBJECTIVE:                                                                                                                                                                                           SUBJECTIVE STATEMENT:  Standing creates less pain. Prolonged sitting > 1 hour = 6/10.  PERTINENT HISTORY:  Scoliosis Mother has osteopenia Mom recently diagnosed with cancer  PAIN:  Are you having pain? Yes: NPRS scale: 8/10 Pain location: left  and mid back and sometime left glute is numb Pain description: aching Aggravating factors: sitting for a long time makes It worse Relieving factors: get up and move  PRECAUTIONS: None  RED FLAGS: None   WEIGHT BEARING RESTRICTIONS: No  FALLS:  Has patient fallen in last 6 months? No  OCCUPATION: work from home mostly sitting  PLOF: Independent  PATIENT GOALS: get rid of the pain  NEXT MD VISIT: PRN when done with therapy  OBJECTIVE:  Note: Objective measures were completed at Evaluation unless otherwise noted.  DIAGNOSTIC FINDINGS:  Imaging report lumbar spine back pain since June or July 2024   Patient has a lumbar scoliosis with the apex at L3 and severe arthritis of the L4-5 facet joint and L5-S1 impression lumbar scoliosis   Facet arthritis and lumbar scoliosis  PATIENT SURVEYS:  Modified Oswestry 10/50 20% disability   COGNITION: Overall cognitive status: Within functional limits for tasks assessed     SENSATION: Complains of some numbness left glute  MUSCLE LENGTH: Hamstrings: Right ~45 deg ; Left ~ 45 deg  POSTURE: increased lumbar lordosis and right pelvic obliquity  PALPATION: Right hip elevated > left  LUMBAR ROM:   AROM eval  Flexion To ankle 90% available  Extension 30% available*  Right lateral flexion To 3" above knee joint line  Left lateral flexion To 1.5 " above knee joint line   Right rotation   Left rotation    (Blank rows = not tested)  LOWER EXTREMITY ROM:     Active  Right eval Left eval  Hip flexion    Hip extension    Hip abduction    Hip adduction    Hip internal rotation    Hip external rotation    Knee flexion    Knee extension    Ankle dorsiflexion    Ankle plantarflexion    Ankle inversion    Ankle eversion     (Blank rows = not tested)  LOWER EXTREMITY MMT:    MMT Right eval Left eval  Hip flexion 5 4-  Hip extension 4 3+  Hip abduction    Hip adduction    Hip internal rotation    Hip external rotation    Knee flexion 5 4  Knee extension 5 4  Ankle dorsiflexion 5 4-  Ankle plantarflexion    Ankle inversion    Ankle eversion     (Blank rows = not tested)  LUMBAR SPECIAL TESTS:  Straight leg raise test: Negative  FUNCTIONAL TESTS:  5 times sit to stand: 14.38 sec no UE assist  GAIT: Distance walked: 50 ft in clinic Assistive device utilized: None Level of assistance: Modified independence Comments: slight decreased gait speed  TREATMENT DATE:  06/20/23 Self-MET for R anterior innominate x 6" x 10  Bridge x 3" x 10 x 2 Hooklying hip adductor squeeze x 3" x 10 x 2 Hookyling hip abd isometrics with a belt x 3" x 10 x 2 SLR x 10 x 2 Seated abdominal press with physioball, neutral spine, 3" x 10 x 2  06/10/23: Supine: Decompression with RTB 1-4 5reps Posterior pelvic tilt  March with UE opposite LE with ab set 10x 3-5 Bridge 10x DKTC 2x 30"  Sidelying: Clam with RTB 5x 5"  05/06/23: Decompression 1-5 Wall arch x 10 Hip excursion x 3 Knee to chest x 3 Supine active hamstring stretch x 3 Bridge x 10 Abdominal set x 10 Bend knee raise x 10  Clam x 10      06/02/2023 physical therapy evaluation and HEP instruction  PATIENT EDUCATION:  Education details: Patient educated on  exam findings, POC, scope of PT, HEP, and what to expect next visit. Person educated: Patient Education method: Explanation, Demonstration, and Handouts Education comprehension: verbalized understanding, returned demonstration, verbal cues required, and tactile cues required  HOME EXERCISE PROGRAM: Access Code: [email or web address] URL: [email or web address] 06/20/23 - Hooklying Isometric Hip Abduction with Belt  - 2 x daily - 7 x weekly - 2 sets - 10 reps - 3 hold - Supine Hip Adduction Isometric with Ball  - 2 x daily - 7 x weekly - 2 sets - 10 reps - 3 hold - Supine Active Straight Leg Raise  - 2 x daily - 7 x weekly - 2 sets - 10 reps - Seated Abdominal Press into [employer]  - 2 x daily - 7 x weekly - 2 sets - 10 reps - 3 hold  06/10/23:  decompression exercises with RTB  Decompression exercises 1-5 Access Code: [email or web address] URL: [email or web address] Date: 06/06/2023 Prepared by: Virgina Organ  Exercises - Supine Single Knee to Chest Stretch  - 2 x daily - 7 x weekly - 1 sets - 3 reps - 30" hold - Supine Hamstring Stretch  - 2 x daily - 7 x weekly - 1 sets - 3 reps - 30" hold - Beginner Bridge  - 2 x daily - 7 x weekly - 1 sets - 10 reps - 5" hold3/25  ASSESSMENT:  CLINICAL IMPRESSION: Interventions today were geared towards pelvic alignment, LE and core strengthening. Patient demonstrated R ant innominate (R LE longer in supine that got shorter in supine to sit). B medial malleoli were level at the end of self-MET. Tolerated all activities without worsening of symptoms. Demonstrated appropriate levels of fatigue. Rest periods provided. Provided slight amount of cueing to ensure correct execution of activity with good carry-over. To date, skilled PT is required to address the impairments and improve function.    OBJECTIVE IMPAIRMENTS: Abnormal gait, decreased activity tolerance, decreased knowledge of condition, decreased mobility, decreased ROM, decreased strength,  hypomobility, increased fascial restrictions, impaired perceived functional ability, impaired flexibility, postural dysfunction, and pain.   ACTIVITY LIMITATIONS: lifting, bending, sitting, squatting, dressing, and caring for others  PARTICIPATION LIMITATIONS: meal prep, cleaning, laundry, driving, and occupation  PERSONAL FACTORS: Profession are also affecting patient's functional outcome.   REHAB POTENTIAL: Good  CLINICAL DECISION MAKING: Stable/uncomplicated  EVALUATION COMPLEXITY: Low   GOALS: Goals reviewed with patient? No  SHORT TERM GOALS: Target date: 06/16/2023  patient will be independent with initial HEP  Baseline: Goal status: on-going  2.  Patient will self report 30% improvement to improve tolerance for functional activity  Baseline:  Goal status: on-going   LONG TERM GOALS: Target date: 06/30/2023  Patient will be independent in self management strategies to improve quality of life and functional outcomes.  Baseline:  Goal status: on-going  2.  Patient will self report 50% improvement to improve tolerance for functional activity  Baseline:  Goal status: on-going  3.  Patient will increase bilateral  leg MMTs to 5/5 without pain to promote return to ambulation community distances with minimal deviation.  Baseline: see above Goal status: on-going  4.  Patient will improve lumbar AROM to wfl throughout without pain to improve ability to dress without hinderance. Baseline: see above Goal status: on-going  5.  Patient will improve modified Oswestry score to 10% or less to demonstrate improved perceived functional mobility Baseline:  Goal status:on-going  6.  Patient will be able to sit for  work x 1 hour without pain > 2/10 in her low back to increase work efficiency Baseline:  Goal status: on-going PLAN:  PT FREQUENCY: 2x/week  PT DURATION: 4 weeks  PLANNED INTERVENTIONS: 97164- PT Re-evaluation, 97110-Therapeutic exercises, 97530-  Therapeutic activity, [id number]- Neuromuscular re-education, 97535- Self Care, 78295- Manual therapy, [phone]- Gait training, [phone]- Orthotic Fit/training, [phone]- Canalith repositioning, [id number]- Aquatic Therapy, [phone]- Splinting, Patient/Family education, Balance training, Stair training, Taping, Dry Needling, Joint mobilization, Joint manipulation, Spinal manipulation, Spinal mobilization, Scar mobilization, and DME instructions. Marland Kitchen  PLAN FOR NEXT SESSION:  address lumbar mobility, core and postural strength; scoliosis; leg strength. Bridge  Iantha Fallen L. Poppi Scantling, PT, DPT, OCS Board-Certified Clinical Specialist in Orthopedic PT PT Compact Privilege # ([place]): [phone] T 06/20/2023, 4:53 PM

## 2023-06-23 ENCOUNTER — Encounter (HOSPITAL_COMMUNITY): Payer: No Typology Code available for payment source

## 2023-06-24 ENCOUNTER — Encounter (HOSPITAL_COMMUNITY): Payer: No Typology Code available for payment source | Admitting: Physical Therapy

## 2023-06-26 ENCOUNTER — Encounter (HOSPITAL_COMMUNITY): Payer: No Typology Code available for payment source

## 2023-06-27 ENCOUNTER — Encounter (HOSPITAL_COMMUNITY): Payer: No Typology Code available for payment source

## 2023-06-30 ENCOUNTER — Ambulatory Visit (HOSPITAL_COMMUNITY): Payer: No Typology Code available for payment source | Admitting: Physical Therapy

## 2023-06-30 ENCOUNTER — Encounter (HOSPITAL_COMMUNITY): Payer: No Typology Code available for payment source

## 2023-06-30 DIAGNOSIS — M545 Low back pain, unspecified: Secondary | ICD-10-CM | POA: Diagnosis not present

## 2023-06-30 NOTE — Therapy (Signed)
OUTPATIENT PHYSICAL THERAPY THORACOLUMBAR Treatment/Discharge   Patient Name: Mackenzie Delacruz MRN: [id number] DOB:[birth year], 48 y.o., female Today's Date: 06/30/2023  END OF SESSION:  PT End of Session - 06/30/23 1549     Visit Number 5    Number of Visits 5    Date for PT Re-Evaluation 06/30/23    Authorization Type [employer]/ [place] Preferred    PT Start Time 1515    PT Stop Time 1545    PT Time Calculation (min) 30 min    Activity Tolerance Patient tolerated treatment well    Behavior During Therapy WFL for tasks assessed/performed             Past Medical History:  Diagnosis Date   GERD (gastroesophageal reflux disease)    Past Surgical History:  Procedure Laterality Date   APPENDECTOMY     HYSTEROSCOPY N/A 04/14/2019   Procedure: HYSTEROSCOPY WITH HYDROTHERMAL ABLATION;  Surgeon: Jaymes Graff, MD;  Location: [facility];  Service: Gynecology;  Laterality: N/A;   LAPAROSCOPIC BILATERAL SALPINGECTOMY Bilateral 04/14/2019   Procedure: LAPAROSCOPIC BILATERAL SALPINGECTOMY;  Surgeon: Jaymes Graff, MD;  Location: [facility];  Service: Gynecology;  Laterality: Bilateral;   Patient Active Problem List   Diagnosis Date Noted   Snoring 03/13/2023   Family history of colon cancer 03/13/2023   Acute non-recurrent frontal sinusitis 11/20/2022   Right leg pain 02/28/2022   Gastroesophageal reflux disease without esophagitis 02/02/2021   Encounter for general adult medical examination with abnormal findings 02/02/2021   Family history of breast cancer 02/02/2021   Mixed hyperlipidemia 02/02/2021    PCP: Trena Platt, MD  REFERRING PROVIDER: Vickki Hearing, MD  REFERRING DIAG: M54.50 (ICD-10-CM) - Lumbar pain M47.816 (ICD-10-CM) - Facet arthritis of lumbar region M41.26 (ICD-10-CM) - Other idiopathic scoliosis, lumbar region  Rationale for Evaluation and Treatment: Rehabilitation  THERAPY DIAG:  Low back pain, unspecified back  pain laterality, unspecified chronicity, unspecified whether sciatica present  ONSET DATE: since the summer; 6 months  SUBJECTIVE:      PT states that she is not taking the pain medication any longer.  She is getting up every 30 minutes.  She can sit for an hour and then her pain is a 6. Continues tot be the same.                                                                                                                                                                                      SUBJECTIVE STATEMENT:  Standing creates less pain. Prolonged sitting > 1 hour = 6/10.  PERTINENT HISTORY:  Scoliosis Mother has osteopenia Mom recently diagnosed with cancer  PAIN:  Are  you having pain? Yes: NPRS scale: 8/10 Pain location: left  and mid back and sometime left glute is numb Pain description: aching Aggravating factors: sitting for a long time makes It worse Relieving factors: get up and move  PRECAUTIONS: None  RED FLAGS: None   WEIGHT BEARING RESTRICTIONS: No  FALLS:  Has patient fallen in last 6 months? No  OCCUPATION: work from home mostly sitting  PLOF: Independent  PATIENT GOALS: get rid of the pain  NEXT MD VISIT: PRN when done with therapy  OBJECTIVE:  Note: Objective measures were completed at Evaluation unless otherwise noted.  DIAGNOSTIC FINDINGS:  Imaging report lumbar spine back pain since June or July 2024   Patient has a lumbar scoliosis with the apex at L3 and severe arthritis of the L4-5 facet joint and L5-S1 impression lumbar scoliosis   Facet arthritis and lumbar scoliosis  PATIENT SURVEYS:  Modified Oswestry 10/50            Modified Oswestrty 3/50 COGNITION: Overall cognitive status: Within functional limits for tasks assessed     SENSATION: Complains of some numbness left glute  MUSCLE LENGTH: Hamstrings: Right ~45 deg ; Left ~ 45 deg 06/30/23: supine: Rt 170; LT 160  POSTURE: increased lumbar lordosis and right pelvic  obliquity  PALPATION: Right hip elevated > left; 06/30/23: Rt hip continues to be high   LUMBAR ROM:   AROM eval 06/30/23  Flexion To ankle 90% available Wfl  Extension 30% available* wfl  Right lateral flexion To 3" above knee joint line Jt line  Left lateral flexion To 1.5 " above knee joint line Jt line   Right rotation    Left rotation     (Blank rows = not tested)  LOWER EXTREMITY MMT:    MMT Right eval 06/30/23 Left eval 06/30/23  Hip flexion 5 5 4- 5  Hip extension 4 4+ 3+ 4+  Hip abduction      Hip adduction      Hip internal rotation      Hip external rotation      Knee flexion 5 5 4  4+  Knee extension 5 5 4 5   Ankle dorsiflexion 5 5 4- 5  Ankle plantarflexion      Ankle inversion      Ankle eversion       (Blank rows = not tested)  LUMBAR SPECIAL TESTS:  Straight leg raise test: Negative  FUNCTIONAL TESTS:  5 times sit to stand: 14.38 sec no UE assist 06/30/23:  5 sit to stand :  10 seconds   GAIT: Distance walked: 50 ft in clinic Assistive device utilized: None Level of assistance: Modified independence Comments: slight decreased gait speed  TREATMENT DATE:  06/30/23 reassessment see above  MET for Rt anterior innominate   Lt hamstring stretch x 3 for 30" Sit to stand x 10 reps Bridge x 10  Hip adduction x 10  06/20/23 Self-MET for R anterior innominate x 6" x 10  Bridge x 3" x 10 x 2 Hooklying hip adductor squeeze x 3" x 10 x 2 Hookyling hip abd isometrics with a belt x 3" x 10 x 2 SLR x 10 x 2 Seated abdominal press with physioball, neutral spine, 3" x 10 x 2  06/10/23: Supine: Decompression with RTB 1-4 5reps Posterior pelvic tilt  March with UE opposite LE with ab set 10x 3-5 Bridge 10x DKTC 2x 30"  Sidelying: Clam with RTB 5x 5"   PATIENT EDUCATION:  Education details: Patient  educated on exam findings, POC, scope of PT, HEP, and what to expect next visit. Person educated: Patient Education method: Explanation, Demonstration, and  Handouts Education comprehension: verbalized understanding, returned demonstration, verbal cues required, and tactile cues required  HOME EXERCISE PROGRAM:06/30/23:  Sit to stand  Access Code: [email or web address] URL: [email or web address] 06/20/23 - Hooklying Isometric Hip Abduction with Belt  - 2 x daily - 7 x weekly - 2 sets - 10 reps - 3 hold - Supine Hip Adduction Isometric with Ball  - 2 x daily - 7 x weekly - 2 sets - 10 reps - 3 hold - Supine Active Straight Leg Raise  - 2 x daily - 7 x weekly - 2 sets - 10 reps - Seated Abdominal Press into [employer]  - 2 x daily - 7 x weekly - 2 sets - 10 reps - 3 hold  06/10/23:  decompression exercises with RTB  Decompression exercises 1-5 Access Code: [email or web address] URL: [email or web address] Date: 06/06/2023 Prepared by: Virgina Organ  Exercises - Supine Single Knee to Chest Stretch  - 2 x daily - 7 x weekly - 1 sets - 3 reps - 30" hold - Supine Hamstring Stretch  - 2 x daily - 7 x weekly - 1 sets - 3 reps - 30" hold - Beginner Bridge  - 2 x daily - 7 x weekly - 1 sets - 10 reps - 5" hold3/25  ASSESSMENT:  CLINICAL IMPRESSION: PT reassessed today with good improvements noted.  Pt feels that she is ready for discharge. Therapist instructed pt to complete sit to stand at least 3 x /day.    OBJECTIVE IMPAIRMENTS: Abnormal gait, decreased activity tolerance, decreased knowledge of condition, decreased mobility, decreased ROM, decreased strength, hypomobility, increased fascial restrictions, impaired perceived functional ability, impaired flexibility, postural dysfunction, and pain.   ACTIVITY LIMITATIONS: lifting, bending, sitting, squatting, dressing, and caring for others  PARTICIPATION LIMITATIONS: meal prep, cleaning, laundry, driving, and occupation  PERSONAL FACTORS: Profession are also affecting patient's functional outcome.   REHAB POTENTIAL: Good  CLINICAL DECISION MAKING: Stable/uncomplicated  EVALUATION  COMPLEXITY: Low   GOALS: Goals reviewed with patient? No  SHORT TERM GOALS: Target date: 06/16/2023  patient will be independent with initial HEP  Baseline: Goal status:met  2.  Patient will self report 30% improvement to improve tolerance for functional activity  Baseline:  Goal status: met   LONG TERM GOALS: Target date: 06/30/2023  Patient will be independent in self management strategies to improve quality of life and functional outcomes.  Baseline:  Goal status: met  2.  Patient will self report 50% improvement to improve tolerance for functional activity  Baseline:  Goal status: met  3.  Patient will increase bilateral  leg MMTs to 5/5 without pain to promote return to ambulation community distances with minimal deviation.  Baseline: see above Goal status: partially met   4.  Patient will improve lumbar AROM to wfl throughout without pain to improve ability to dress without hinderance. Baseline: see above Goal status: met  5.  Patient will improve modified Oswestry score to 10% or less to demonstrate improved perceived functional mobility Baseline:  Goal status:met  6.  Patient will be able to sit for work x 1 hour without pain > 2/10 in her low back to increase work efficiency Baseline:  Goal status:partially  met  PLAN:  PT FREQUENCY: 2x/week  PT DURATION: 4 weeks  PLANNED INTERVENTIONS: 97164- PT Re-evaluation, 97110-Therapeutic exercises, 97530- Therapeutic activity, [id number]- Neuromuscular re-education, 97535- Self Care, 14782-  Manual therapy, [id number]- Gait training, 16109- Orthotic Fit/training, 60454- Canalith repositioning, 09811- Aquatic Therapy, [phone]- Splinting, Patient/Family education, Balance training, Stair training, Taping, Dry Needling, Joint mobilization, Joint manipulation, Spinal manipulation, Spinal mobilization, Scar mobilization, and DME instructions. Marland Kitchen  PLAN FOR NEXT SESSION:  PT reassessed discharged  [place], [place]  CLT [phone]

## 2023-07-16 ENCOUNTER — Other Ambulatory Visit (HOSPITAL_COMMUNITY)
Admission: RE | Admit: 2023-07-16 | Discharge: 2023-07-16 | Disposition: A | Discharge status: Home / Self Care | Payer: No Typology Code available for payment source | Source: Ambulatory Visit | Attending: Gastroenterology | Admitting: Gastroenterology

## 2023-07-16 DIAGNOSIS — Z1211 Encounter for screening for malignant neoplasm of colon: Secondary | ICD-10-CM | POA: Insufficient documentation

## 2023-07-16 LAB — PREGNANCY, URINE: Preg Test, Ur: NEGATIVE

## 2023-07-21 ENCOUNTER — Encounter (HOSPITAL_COMMUNITY)
Admission: RE | Disposition: A | Discharge status: Home / Self Care | Payer: Self-pay | Source: Home / Self Care | Attending: Gastroenterology

## 2023-07-21 ENCOUNTER — Ambulatory Visit (HOSPITAL_COMMUNITY)
Admission: RE | Admit: 2023-07-21 | Discharge: 2023-07-21 | Disposition: A | Discharge status: Home / Self Care | Payer: No Typology Code available for payment source | Attending: Gastroenterology | Admitting: Gastroenterology

## 2023-07-21 ENCOUNTER — Other Ambulatory Visit: Payer: Self-pay

## 2023-07-21 ENCOUNTER — Ambulatory Visit (HOSPITAL_COMMUNITY): Payer: No Typology Code available for payment source | Admitting: Anesthesiology

## 2023-07-21 ENCOUNTER — Encounter (INDEPENDENT_AMBULATORY_CARE_PROVIDER_SITE_OTHER): Payer: Self-pay | Admitting: *Deleted

## 2023-07-21 ENCOUNTER — Encounter (HOSPITAL_COMMUNITY): Payer: Self-pay | Admitting: Gastroenterology

## 2023-07-21 DIAGNOSIS — Z87891 Personal history of nicotine dependence: Secondary | ICD-10-CM | POA: Insufficient documentation

## 2023-07-21 DIAGNOSIS — Z1211 Encounter for screening for malignant neoplasm of colon: Secondary | ICD-10-CM | POA: Diagnosis present

## 2023-07-21 DIAGNOSIS — Z800 Family history of malignant neoplasm of digestive organs: Secondary | ICD-10-CM | POA: Insufficient documentation

## 2023-07-21 DIAGNOSIS — K648 Other hemorrhoids: Secondary | ICD-10-CM | POA: Insufficient documentation

## 2023-07-21 DIAGNOSIS — K649 Unspecified hemorrhoids: Secondary | ICD-10-CM

## 2023-07-21 DIAGNOSIS — K640 First degree hemorrhoids: Secondary | ICD-10-CM

## 2023-07-21 HISTORY — PX: COLONOSCOPY WITH PROPOFOL: SHX5780

## 2023-07-21 HISTORY — DX: Scoliosis, unspecified: M41.9

## 2023-07-21 LAB — HM COLONOSCOPY

## 2023-07-21 SURGERY — COLONOSCOPY WITH PROPOFOL
Anesthesia: General

## 2023-07-21 MED ORDER — SODIUM CHLORIDE 0.9 % IV SOLN: INTRAVENOUS | Status: DC | PRN

## 2023-07-21 MED ORDER — PROPOFOL 10 MG/ML IV BOLUS
INTRAVENOUS | Status: DC | PRN
  Administered 2023-07-21: 80 mg via INTRAVENOUS

## 2023-07-21 MED ORDER — LIDOCAINE HCL (PF) 2 % IJ SOLN
INTRAMUSCULAR | Status: DC | PRN
  Administered 2023-07-21: 60 mg via INTRADERMAL

## 2023-07-21 MED ORDER — LACTATED RINGERS IV SOLN: INTRAVENOUS | Status: DC

## 2023-07-21 MED ORDER — PROPOFOL 500 MG/50ML IV EMUL
INTRAVENOUS | Status: DC | PRN
  Administered 2023-07-21: 150 ug/kg/min via INTRAVENOUS

## 2023-07-21 MED ORDER — LIDOCAINE HCL (PF) 2 % IJ SOLN
INTRAMUSCULAR | Status: AC
Start: 2023-07-21 — End: ?
  Filled 2023-07-21: qty 25

## 2023-07-21 NOTE — Transfer of Care (Signed)
 Immediate Anesthesia Transfer of Care Note  Patient: Mackenzie Delacruz  Procedure(s) Performed: COLONOSCOPY WITH PROPOFOL  Patient Location: Endoscopy Unit  Anesthesia Type:General  Level of Consciousness: awake and alert   Airway & Oxygen Therapy: Patient Spontanous Breathing  Post-op Assessment: Report given to RN and Post -op Vital signs reviewed and stable  Post vital signs: Reviewed and stable  Last Vitals:  Vitals Value Taken Time  BP 110/60   Temp 98   Pulse 75   Resp 16   SpO2 98     Last Pain:  Vitals:   07/21/23 0730  TempSrc:   PainSc: 0-No pain      Patients Stated Pain Goal: 10 (07/21/23 0644)  Complications: No notable events documented.

## 2023-07-21 NOTE — Discharge Instructions (Addendum)

## 2023-07-21 NOTE — Anesthesia Preprocedure Evaluation (Signed)
 Anesthesia Evaluation  Patient identified by MRN, date of birth, ID band Patient awake    Reviewed: Allergy & Precautions, H&P , NPO status , Patient's Chart, lab work & pertinent test results, reviewed documented beta blocker date and time   Airway Mallampati: II  TM Distance: >3 FB Neck ROM: full    Dental no notable dental hx.    Pulmonary neg pulmonary ROS, former smoker   Pulmonary exam normal breath sounds clear to auscultation       Cardiovascular Exercise Tolerance: Good hypertension, negative cardio ROS  Rhythm:regular Rate:Normal     Neuro/Psych negative neurological ROS  negative psych ROS   GI/Hepatic negative GI ROS, Neg liver ROS,GERD  ,,  Endo/Other  negative endocrine ROS    Renal/GU negative Renal ROS  negative genitourinary   Musculoskeletal   Abdominal   Peds  Hematology negative hematology ROS (+)   Anesthesia Other Findings   Reproductive/Obstetrics negative OB ROS                             Anesthesia Physical Anesthesia Plan  ASA: 2  Anesthesia Plan: General   Post-op Pain Management:    Induction:   PONV Risk Score and Plan: Propofol infusion  Airway Management Planned:   Additional Equipment:   Intra-op Plan:   Post-operative Plan:   Informed Consent: I have reviewed the patients History and Physical, chart, labs and discussed the procedure including the risks, benefits and alternatives for the proposed anesthesia with the patient or authorized representative who has indicated his/her understanding and acceptance.     Dental Advisory Given  Plan Discussed with: CRNA  Anesthesia Plan Comments:        Anesthesia Quick Evaluation

## 2023-07-21 NOTE — H&P (Signed)
 Primary Care Physician:  Anabel Halon, MD Primary Gastroenterologist:  Dr. Tasia Catchings  Pre-Procedure History & Physical: HPI:  Mackenzie Delacruz is a 48 y.o. female is here for a colonoscopy for colon cancer screening purposes.  family history of colorectal cancer in uncle and multiple members with other cancers; breast, thymoma etc .  No melena or hematochezia.  No abdominal pain or unintentional weight loss.  No change in bowel habits.  Overall feels well from a GI standpoint.  Past Medical History:  Diagnosis Date   GERD (gastroesophageal reflux disease)    Scoliosis     Past Surgical History:  Procedure Laterality Date   APPENDECTOMY     HYSTEROSCOPY N/A 04/14/2019   Procedure: HYSTEROSCOPY WITH HYDROTHERMAL ABLATION;  Surgeon: Jaymes Graff, MD;  Location: [facility];  Service: Gynecology;  Laterality: N/A;   LAPAROSCOPIC BILATERAL SALPINGECTOMY Bilateral 04/14/2019   Procedure: LAPAROSCOPIC BILATERAL SALPINGECTOMY;  Surgeon: Jaymes Graff, MD;  Location: [facility];  Service: Gynecology;  Laterality: Bilateral;    Prior to Admission medications   Medication Sig Start Date End Date Taking? Authorizing Provider  Multiple Vitamin (MULTIVITAMIN WITH MINERALS) TABS tablet Take 1 tablet by mouth daily. Patient not taking: Reported on 06/02/2023    [provider]  tiZANidine (ZANAFLEX) 4 MG tablet Take 1 tablet (4 mg total) by mouth every 6 (six) hours as needed for muscle spasms. Patient not taking: Reported on 06/02/2023 05/26/23   Vickki Hearing, MD    Allergies as of 06/12/2023 - Review Complete 06/10/2023  Allergen Reaction Noted   Sulfa antibiotics Hives 04/13/2019    History reviewed. No pertinent family history.  Social History   Socioeconomic History   Marital status: Married    Spouse name: Not on file   Number of children: Not on file   Years of education: Not on file   Highest education level: Bachelor's degree  (e.g., BA, AB, BS)  Occupational History   Not on file  Tobacco Use   Smoking status: Former    Current packs/day: 0.00    Types: Cigarettes    Quit date: 08/01/2004    Years since quitting: 18.9   Smokeless tobacco: Never  Vaping Use   Vaping status: Never Used  Substance and Sexual Activity   Alcohol use: Not Currently   Drug use: Never   Sexual activity: Yes  Other Topics Concern   Not on file  Social History Narrative   Not on file   Social Drivers of Health   Financial Resource Strain: Low Risk  (11/19/2022)   Overall Financial Resource Strain (CARDIA)    Difficulty of Paying Living Expenses: Not hard at all  Food Insecurity: No Food Insecurity (11/19/2022)   Hunger Vital Sign    Worried About Running Out of Food in the Last Year: Never true    Ran Out of Food in the Last Year: Never true  Transportation Needs: No Transportation Needs (11/19/2022)   PRAPARE - Administrator, Civil Service (Medical): No    Lack of Transportation (Non-Medical): No  Physical Activity: Insufficiently Active (11/19/2022)   Exercise Vital Sign    Days of Exercise per Week: 4 days    Minutes of Exercise per Session: 30 min  Stress: Stress Concern Present (11/19/2022)   [employer] of Occupational Health - Occupational Stress Questionnaire    Feeling of Stress : To some extent  Social Connections: Moderately Integrated (11/19/2022)   Social Connection and Isolation Panel [NHANES]  Frequency of Communication with Friends and Family: More than three times a week    Frequency of Social Gatherings with Friends and Family: Once a week    Attends Religious Services: More than 4 times per year    Active Member of [employer] or Organizations: No    Attends Engineer, structural: Not on file    Marital Status: Married  Catering manager Violence: Not on file    Review of Systems: See HPI, otherwise negative ROS  Physical Exam: Vital signs in last 24 hours: Temp:  [98.1 F  (36.7 C)] 98.1 F (36.7 C) (02/17 0700) Pulse Rate:  [70] 70 (02/17 0700) Resp:  [18] 18 (02/17 0700) BP: (109)/(68) 109/68 (02/17 0700) SpO2:  [95 %] 95 % (02/17 0700) Weight:  [59 kg] 59 kg (02/17 0644)   General:   Alert,  Well-developed, well-nourished, pleasant and cooperative in NAD Head:  Normocephalic and atraumatic. Eyes:  Sclera clear, no icterus.   Conjunctiva pink. Ears:  Normal auditory acuity. Nose:  No deformity, discharge,  or lesions. Msk:  Symmetrical without gross deformities. Normal posture. Extremities:  Without clubbing or edema. Neurologic:  Alert and  oriented x4;  grossly normal neurologically. Skin:  Intact without significant lesions or rashes. Psych:  Alert and cooperative. Normal mood and affect.  Impression/Plan: Mackenzie Delacruz is here for a colonoscopy to be performed for colon cancer screening purposes.  The risks of the procedure including infection, bleed, or perforation as well as benefits, limitations, alternatives and imponderables have been reviewed with the patient. Questions have been answered. All parties agreeable.

## 2023-07-21 NOTE — Op Note (Signed)
 [facility] Patient Name: Mackenzie Delacruz Procedure Date: 07/21/2023 7:12 AM MRN: [id number] Date of Birth: Jun 28, 1975 Attending MD: Sanjuan Dame , MD, [id number] CSN: [id number] Age: 48 Admit Type: Outpatient Procedure:                Colonoscopy Indications:              Screening for colon cancer: Family history of                            colorectal cancer in distant relative(s) Providers:                Sanjuan Dame, MD, Sheran Fava, Kristine L.                            Jessee Avers, Technician Referring MD:              Medicines:                Monitored Anesthesia Care Complications:            No immediate complications. Estimated Blood Loss:     Estimated blood loss: none. Procedure:                Pre-Anesthesia Assessment:                           - Prior to the procedure, a History and Physical                            was performed, and patient medications and                            allergies were reviewed. The patient's tolerance of                            previous anesthesia was also reviewed. The risks                            and benefits of the procedure and the sedation                            options and risks were discussed with the patient.                            All questions were answered, and informed consent                            was obtained. Prior Anticoagulants: The patient has                            taken no anticoagulant or antiplatelet agents. ASA                            Grade Assessment: II - A patient with mild systemic  disease. After reviewing the risks and benefits,                            the patient was deemed in satisfactory condition to                            undergo the procedure.                           After obtaining informed consent, the colonoscope                            was passed under direct vision. Throughout the                             procedure, the patient's blood pressure, pulse, and                            oxygen saturations were monitored continuously. The                            [id number] scope was introduced through the                            anus and advanced to the the cecum, identified by                            appendiceal orifice and ileocecal valve. The                            colonoscopy was performed without difficulty. The                            patient tolerated the procedure well. The quality                            of the bowel preparation was evaluated using the                            BBPS [facility] Bowel Preparation Scale) with scores                            of: Right Colon = 3, Transverse Colon = 3 and Left                            Colon = 3 (entire mucosa seen well with no residual                            staining, small fragments of stool or opaque                            liquid). The total BBPS score equals 9. The  ileocecal valve, appendiceal orifice, and rectum                            were photographed. Scope In: 7:34:01 AM Scope Out: 7:49:44 AM Scope Withdrawal Time: 0 hours 12 minutes 47 seconds  Total Procedure Duration: 0 hours 15 minutes 43 seconds  Findings:      The perianal and digital rectal examinations were normal.      The entire examined colon appeared normal.      Non-bleeding internal hemorrhoids were found. The hemorrhoids were small. Impression:               - The entire examined colon is normal.                           - Non-bleeding internal hemorrhoids.                           - No specimens collected. Moderate Sedation:      Per Anesthesia Care Recommendation:           - Patient has a contact number available for                            emergencies. The signs and symptoms of potential                            delayed complications were discussed with the                             patient. Return to normal activities tomorrow.                            Written discharge instructions were provided to the                            patient.                           - Resume previous diet.                           - Continue present medications.                           - Repeat colonoscopy in 5 years for screening                            purposes.                           - Return to primary care physician as previously                            scheduled. Procedure Code(s):        --- Professional ---                           N8295, Colorectal  cancer screening; colonoscopy on                            individual not meeting criteria for high risk Diagnosis Code(s):        --- Professional ---                           Z12.11, Encounter for screening for malignant                            neoplasm of colon                           Z80.0, Family history of malignant neoplasm of                            digestive organs                           K64.8, Other hemorrhoids CPT copyright 2022 American Medical Association. All rights reserved. The codes documented in this report are preliminary and upon coder review may  be revised to meet current compliance requirements. Sanjuan Dame, MD Sanjuan Dame, MD 07/21/2023 7:56:21 AM This report has been signed electronically. Number of Addenda: 0

## 2023-07-22 ENCOUNTER — Encounter (HOSPITAL_COMMUNITY): Payer: Self-pay | Admitting: Gastroenterology

## 2023-07-25 NOTE — Anesthesia Postprocedure Evaluation (Signed)
 Anesthesia Post Note  Patient: Mackenzie Delacruz  Procedure(s) Performed: COLONOSCOPY WITH PROPOFOL  Patient location during evaluation: Phase II Anesthesia Type: General Level of consciousness: awake Pain management: pain level controlled Vital Signs Assessment: post-procedure vital signs reviewed and stable Respiratory status: spontaneous breathing and respiratory function stable Cardiovascular status: blood pressure returned to baseline and stable Postop Assessment: no headache and no apparent nausea or vomiting Anesthetic complications: no Comments: Late entry   No notable events documented.   Last Vitals:  Vitals:   07/21/23 0753 07/21/23 0758  BP: (!) 93/51 (!) 94/58  Pulse: 76 75  Resp:  20  Temp: 36.4 C   SpO2: 98% 98%    Last Pain:  Vitals:   07/21/23 0758  TempSrc:   PainSc: 0-No pain                 Windell Norfolk

## 2023-09-08 ENCOUNTER — Telehealth: Admitting: Physician Assistant

## 2023-09-08 DIAGNOSIS — B369 Superficial mycosis, unspecified: Secondary | ICD-10-CM

## 2023-09-08 MED ORDER — NYSTATIN-TRIAMCINOLONE 100000-0.1 UNIT/GM-% EX OINT: 1.0000 | TOPICAL_OINTMENT | Freq: Two times a day (BID) | CUTANEOUS | 0 refills | Status: DC

## 2023-09-08 NOTE — Progress Notes (Signed)
E Visit for Rash  We are sorry that you are not feeling well. Here is how we plan to help!  Based upon your presentation it appears you have a fungal infection.  I have prescribed: and Nystatin cream apply to the affected area twice daily   HOME CARE:  Take cool showers and avoid direct sunlight. Apply cool compress or wet dressings. Take a bath in an oatmeal bath.  Sprinkle content of one Aveeno packet under running faucet with comfortably warm water.  Bathe for 15-20 minutes, 1-2 times daily.  Pat dry with a towel. Do not rub the rash. Use hydrocortisone cream. Take an antihistamine like Benadryl for widespread rashes that itch.  The adult dose of Benadryl is 25-50 mg by mouth 4 times daily. Caution:  This type of medication may cause sleepiness.  Do not drink alcohol, drive, or operate dangerous machinery while taking antihistamines.  Do not take these medications if you have prostate enlargement.  Read package instructions thoroughly on all medications that you take.  GET HELP RIGHT AWAY IF:  Symptoms don't go away after treatment. Severe itching that persists. If you rash spreads or swells. If you rash begins to smell. If it blisters and opens or develops a yellow-brown crust. You develop a fever. You have a sore throat. You become short of breath.  MAKE SURE YOU:  Understand these instructions. Will watch your condition. Will get help right away if you are not doing well or get worse.  Thank you for choosing an e-visit.  Your e-visit answers were reviewed by a board certified advanced clinical practitioner to complete your personal care plan. Depending upon the condition, your plan could have included both over the counter or prescription medications.  Please review your pharmacy choice. Make sure the pharmacy is open so you can pick up prescription now. If there is a problem, you may contact your provider through MyChart messaging and have the prescription routed to  another pharmacy.  Your safety is important to us. If you have drug allergies check your prescription carefully.   For the next 24 hours you can use MyChart to ask questions about today's visit, request a non-urgent call back, or ask for a work or school excuse. You will get an email in the next two days asking about your experience. I hope that your e-visit has been valuable and will speed your recovery.  I have spent 5 minutes in review of e-visit questionnaire, review and updating patient chart, medical decision making and response to patient.   Sherrol Vicars M Tamarick Kovalcik, PA-C  

## 2023-10-02 ENCOUNTER — Ambulatory Visit: Admitting: Internal Medicine

## 2023-10-02 VITALS — BP 106/74 | HR 90 | Ht 60.0 in | Wt 127.4 lb

## 2023-10-02 DIAGNOSIS — J309 Allergic rhinitis, unspecified: Secondary | ICD-10-CM | POA: Diagnosis not present

## 2023-10-02 DIAGNOSIS — L239 Allergic contact dermatitis, unspecified cause: Secondary | ICD-10-CM | POA: Diagnosis not present

## 2023-10-02 MED ORDER — LEVOCETIRIZINE DIHYDROCHLORIDE 5 MG PO TABS: 5.0000 mg | ORAL_TABLET | Freq: Every evening | ORAL | 0 refills | Status: DC

## 2023-10-02 MED ORDER — CLOTRIMAZOLE-BETAMETHASONE 1-0.05 % EX CREA: 1.0000 | TOPICAL_CREAM | Freq: Every day | CUTANEOUS | 0 refills | Status: DC

## 2023-10-02 NOTE — Assessment & Plan Note (Signed)
 Her symptoms are suggestive of allergic sinusitis Advised to use Flonase Xyzal  for allergies Advised to use vaporizer for nasal congestion Warm water gargling for sore throat

## 2023-10-02 NOTE — Patient Instructions (Addendum)
 Please apply Lotrisone  cream over rash area.  Please start taking Xyzal  for allegies.  Please use Flonase for allergies/nasal congestion.

## 2023-10-02 NOTE — Assessment & Plan Note (Signed)
 Most likely allergic dermatitis Due to recent spreading of the rash, will provide antifungal coverage as well Prescribed Lotrisone  cream

## 2023-10-02 NOTE — Progress Notes (Signed)
 Acute Office Visit  Subjective:    Patient ID: Mackenzie Delacruz, female    DOB: 01/11/1976, 48 y.o.   MRN: [id number]  Chief Complaint  Patient presents with   Rash    Pt reports sx of rash spreading on her flank area. Also has scratchy throat.     HPI Patient is in today for complaint of persistent rash over her right flank area and right inner thigh area.  Denies any burning pain.  She had an e-visit, and was prescribed nystatin -triamcinolone  cream about 3 weeks ago.  She has been using it regularly, but has not had any improvement in the rash.  She has been using oatmeal soap as advised by e-visit provider.  Denies any other change in shampoo, lotion, moisturizer or other cleaning material.  She also reports nasal congestion, postnasal drip, sinus pressure related headache and sore throat for the last 3 weeks.  She has not tried any OTC antihistaminic yet.  Past Medical History:  Diagnosis Date   GERD (gastroesophageal reflux disease)    Scoliosis     Past Surgical History:  Procedure Laterality Date   APPENDECTOMY     COLONOSCOPY WITH PROPOFOL  N/A 07/21/2023   Procedure: COLONOSCOPY WITH PROPOFOL ;  Surgeon: Hargis Lias, MD;  Location: [facility];  Service: Endoscopy;  Laterality: N/A;  7:30am;asa 1   HYSTEROSCOPY N/A 04/14/2019   Procedure: HYSTEROSCOPY WITH HYDROTHERMAL ABLATION;  Surgeon: Marylu Soda, MD;  Location: [facility];  Service: Gynecology;  Laterality: N/A;   LAPAROSCOPIC BILATERAL SALPINGECTOMY Bilateral 04/14/2019   Procedure: LAPAROSCOPIC BILATERAL SALPINGECTOMY;  Surgeon: Marylu Soda, MD;  Location: [facility];  Service: Gynecology;  Laterality: Bilateral;    History reviewed. No pertinent family history.  Social History   Socioeconomic History   Marital status: Married    Spouse name: Not on file   Number of children: Not on file   Years of education: Not on file   Highest education level: Associate  degree: occupational, Scientist, product/process development, or vocational program  Occupational History   Not on file  Tobacco Use   Smoking status: Former    Current packs/day: 0.00    Types: Cigarettes    Quit date: 08/01/2004    Years since quitting: 19.1   Smokeless tobacco: Never  Vaping Use   Vaping status: Never Used  Substance and Sexual Activity   Alcohol use: Not Currently   Drug use: Never   Sexual activity: Yes  Other Topics Concern   Not on file  Social History Narrative   Not on file   Social Drivers of Health   Financial Resource Strain: Low Risk  (10/02/2023)   Overall Financial Resource Strain (CARDIA)    Difficulty of Paying Living Expenses: Not hard at all  Food Insecurity: No Food Insecurity (10/02/2023)   Hunger Vital Sign    Worried About Running Out of Food in the Last Year: Never true    Ran Out of Food in the Last Year: Never true  Transportation Needs: No Transportation Needs (10/02/2023)   PRAPARE - Administrator, Civil Service (Medical): No    Lack of Transportation (Non-Medical): No  Physical Activity: Sufficiently Active (10/02/2023)   Exercise Vital Sign    Days of Exercise per Week: 5 days    Minutes of Exercise per Session: 60 min  Stress: Stress Concern Present (10/02/2023)   [employer] of Occupational Health - Occupational Stress Questionnaire    Feeling of Stress : To some  extent  Social Connections: Moderately Integrated (10/02/2023)   Social Connection and Isolation Panel [NHANES]    Frequency of Communication with Friends and Family: More than three times a week    Frequency of Social Gatherings with Friends and Family: Once a week    Attends Religious Services: More than 4 times per year    Active Member of [employer] or Organizations: No    Attends Engineer, structural: Not on file    Marital Status: Married  Catering manager Violence: Not on file    Outpatient Medications Prior to Visit  Medication Sig Dispense Refill   Multiple Vitamin  (MULTIVITAMIN WITH MINERALS) TABS tablet Take 1 tablet by mouth daily.     tiZANidine  (ZANAFLEX ) 4 MG tablet Take 1 tablet (4 mg total) by mouth every 6 (six) hours as needed for muscle spasms. 30 tablet 0   nystatin -triamcinolone  ointment (MYCOLOG) Apply 1 Application topically 2 (two) times daily. 30 g 0   No facility-administered medications prior to visit.    Allergies  Allergen Reactions   Sulfa Antibiotics Hives    Review of Systems  Constitutional:  Negative for chills and fever.  HENT:  Positive for congestion, postnasal drip, sinus pressure and sore throat.   Respiratory:  Negative for cough and shortness of breath.   Cardiovascular:  Negative for chest pain and palpitations.  Gastrointestinal:  Negative for diarrhea, nausea and vomiting.  Genitourinary:  Negative for dysuria and hematuria.  Musculoskeletal:  Negative for neck pain and neck stiffness.  Skin:  Positive for rash.  Neurological:  Negative for dizziness and weakness.  Psychiatric/Behavioral:  Negative for agitation and behavioral problems.        Objective:    Physical Exam Vitals reviewed.  Constitutional:      General: She is not in acute distress.    Appearance: She is not diaphoretic.  HENT:     Head: Normocephalic and atraumatic.     Nose: Congestion present.     Right Sinus: Frontal sinus tenderness present.     Left Sinus: Frontal sinus tenderness present.     Mouth/Throat:     Mouth: Mucous membranes are moist.  Eyes:     General: No scleral icterus.    Extraocular Movements: Extraocular movements intact.  Cardiovascular:     Rate and Rhythm: Normal rate and regular rhythm.     Pulses: Normal pulses.     Heart sounds: Normal heart sounds. No murmur heard. Pulmonary:     Breath sounds: Normal breath sounds. No wheezing or rales.  Musculoskeletal:     Cervical back: Neck supple. No tenderness.     Right lower leg: No edema.     Left lower leg: No edema.  Skin:    General: Skin is warm.      Findings: Rash (Erythematous patches over right flank and groin area) present.  Neurological:     General: No focal deficit present.     Mental Status: She is alert and oriented to person, place, and time.  Psychiatric:        Mood and Affect: Mood normal.        Behavior: Behavior normal.     BP 106/74   Pulse 90   Ht 5' (1.524 m)   Wt 127 lb 6.4 oz (57.8 kg)   SpO2 97%   BMI 24.88 kg/m  Wt Readings from Last 3 Encounters:  10/02/23 127 lb 6.4 oz (57.8 kg)  07/21/23 130 lb (59 kg)  05/26/23 132  lb (59.9 kg)        Assessment & Plan:   Problem List Items Addressed This Visit       Respiratory   Allergic sinusitis   Her symptoms are suggestive of allergic sinusitis Advised to use Flonase Xyzal  for allergies Advised to use vaporizer for nasal congestion Warm water gargling for sore throat      Relevant Medications   levocetirizine (XYZAL ) 5 MG tablet     Musculoskeletal and Integument   Allergic dermatitis - Primary   Most likely allergic dermatitis Due to recent spreading of the rash, will provide antifungal coverage as well Prescribed Lotrisone  cream      Relevant Medications   clotrimazole -betamethasone  (LOTRISONE ) cream     Meds ordered this encounter  Medications   clotrimazole -betamethasone  (LOTRISONE ) cream    Sig: Apply 1 Application topically daily.    Dispense:  30 g    Refill:  0   levocetirizine (XYZAL ) 5 MG tablet    Sig: Take 1 tablet (5 mg total) by mouth every evening.    Dispense:  30 tablet    Refill:  0     Shawna Wearing Alyssa Backbone, MD

## 2024-01-13 ENCOUNTER — Other Ambulatory Visit: Payer: Self-pay | Admitting: Medical Genetics

## 2024-02-17 ENCOUNTER — Other Ambulatory Visit (HOSPITAL_COMMUNITY): Payer: Self-pay | Admitting: Internal Medicine

## 2024-02-17 DIAGNOSIS — Z1231 Encounter for screening mammogram for malignant neoplasm of breast: Secondary | ICD-10-CM

## 2024-02-23 ENCOUNTER — Encounter (HOSPITAL_COMMUNITY): Payer: Self-pay

## 2024-02-23 ENCOUNTER — Ambulatory Visit (HOSPITAL_COMMUNITY)
Admission: RE | Admit: 2024-02-23 | Discharge: 2024-02-23 | Disposition: A | Discharge status: Home / Self Care | Source: Ambulatory Visit

## 2024-02-23 DIAGNOSIS — Z1231 Encounter for screening mammogram for malignant neoplasm of breast: Secondary | ICD-10-CM | POA: Insufficient documentation

## 2024-03-17 ENCOUNTER — Ambulatory Visit (INDEPENDENT_AMBULATORY_CARE_PROVIDER_SITE_OTHER): Payer: No Typology Code available for payment source | Admitting: Internal Medicine

## 2024-03-17 ENCOUNTER — Encounter (INDEPENDENT_AMBULATORY_CARE_PROVIDER_SITE_OTHER): Payer: Self-pay | Admitting: Gastroenterology

## 2024-03-17 VITALS — BP 109/58 | HR 74 | Resp 16 | Ht 60.0 in | Wt 127.4 lb

## 2024-03-17 DIAGNOSIS — Z0001 Encounter for general adult medical examination with abnormal findings: Secondary | ICD-10-CM

## 2024-03-17 DIAGNOSIS — E782 Mixed hyperlipidemia: Secondary | ICD-10-CM

## 2024-03-17 DIAGNOSIS — K219 Gastro-esophageal reflux disease without esophagitis: Secondary | ICD-10-CM

## 2024-03-17 DIAGNOSIS — J309 Allergic rhinitis, unspecified: Secondary | ICD-10-CM | POA: Diagnosis not present

## 2024-03-17 DIAGNOSIS — Z23 Encounter for immunization: Secondary | ICD-10-CM

## 2024-03-17 MED ORDER — LEVOCETIRIZINE DIHYDROCHLORIDE 5 MG PO TABS: 5.0000 mg | ORAL_TABLET | Freq: Every evening | ORAL | 3 refills | Status: DC

## 2024-03-17 NOTE — Assessment & Plan Note (Signed)
 Well-controlled with Pepcid  PRN Avoid hot and spicy food

## 2024-03-17 NOTE — Progress Notes (Signed)
 Established Patient Office Visit  Subjective:  Patient ID: Mackenzie Delacruz, female    DOB: 23-Jul-1975  Age: 48 y.o. MRN: [id number]  CC:  Chief Complaint  Patient presents with   Annual Exam    HPI Mackenzie Delacruz is a 48 y.o. female with past medical history of HLD who presents for annual physical.  She has been brought blood test results from her workplace, which have been discussed with the patient in detail.  She has acid reflux, and has been doing well with as needed Pepcid .  She has tried Tums with mild relief.  Denies any nausea or vomiting.  She has a history of allergic sinusitis, takes Xyzal  as needed.  She has history of HLD.  Last lipid profile showed LDL of 133, which is stable compared to prior.  She does not have a history of HTN, type II DM, CVA or CAD.  Past Medical History:  Diagnosis Date   GERD (gastroesophageal reflux disease)    Scoliosis     Past Surgical History:  Procedure Laterality Date   APPENDECTOMY     COLONOSCOPY WITH PROPOFOL  N/A 07/21/2023   Procedure: COLONOSCOPY WITH PROPOFOL ;  Surgeon: Cinderella Deatrice FALCON, MD;  Location: [facility];  Service: Endoscopy;  Laterality: N/A;  7:30am;asa 1   HYSTEROSCOPY N/A 04/14/2019   Procedure: HYSTEROSCOPY WITH HYDROTHERMAL ABLATION;  Surgeon: Armond Cape, MD;  Location: [facility];  Service: Gynecology;  Laterality: N/A;   LAPAROSCOPIC BILATERAL SALPINGECTOMY Bilateral 04/14/2019   Procedure: LAPAROSCOPIC BILATERAL SALPINGECTOMY;  Surgeon: Armond Cape, MD;  Location: [facility];  Service: Gynecology;  Laterality: Bilateral;    History reviewed. No pertinent family history.  Social History   Socioeconomic History   Marital status: Married    Spouse name: Not on file   Number of children: Not on file   Years of education: Not on file   Highest education level: Bachelor's degree (e.g., BA, AB, BS)  Occupational History   Not on file  Tobacco Use   Smoking  status: Former    Current packs/day: 0.00    Types: Cigarettes    Quit date: 08/01/2004    Years since quitting: 19.6   Smokeless tobacco: Never  Vaping Use   Vaping status: Never Used  Substance and Sexual Activity   Alcohol use: Not Currently   Drug use: Never   Sexual activity: Yes  Other Topics Concern   Not on file  Social History Narrative   Not on file   Social Drivers of Health   Financial Resource Strain: Low Risk  (03/17/2024)   Overall Financial Resource Strain (CARDIA)    Difficulty of Paying Living Expenses: Not hard at all  Food Insecurity: No Food Insecurity (03/17/2024)   Hunger Vital Sign    Worried About Running Out of Food in the Last Year: Never true    Ran Out of Food in the Last Year: Never true  Transportation Needs: No Transportation Needs (03/17/2024)   PRAPARE - Administrator, Civil Service (Medical): No    Lack of Transportation (Non-Medical): No  Physical Activity: Sufficiently Active (03/17/2024)   Exercise Vital Sign    Days of Exercise per Week: 4 days    Minutes of Exercise per Session: 60 min  Stress: No Stress Concern Present (03/17/2024)   [employer] of Occupational Health - Occupational Stress Questionnaire    Feeling of Stress: Not at all  Social Connections: Moderately Integrated (03/17/2024)   Social Connection and  Isolation Panel    Frequency of Communication with Friends and Family: More than three times a week    Frequency of Social Gatherings with Friends and Family: Once a week    Attends Religious Services: More than 4 times per year    Active Member of [employer] or Organizations: No    Attends Engineer, structural: Not on file    Marital Status: Married  Catering manager Violence: Not on file    Outpatient Medications Prior to Visit  Medication Sig Dispense Refill   Magnesium 200 MG TABS Take 1 tablet by mouth daily.     Multiple Vitamin (MULTIVITAMIN WITH MINERALS) TABS tablet Take 1 tablet by  mouth daily.     clotrimazole -betamethasone  (LOTRISONE ) cream Apply 1 Application topically daily. 30 g 0   levocetirizine (XYZAL ) 5 MG tablet Take 1 tablet (5 mg total) by mouth every evening. 30 tablet 0   tiZANidine  (ZANAFLEX ) 4 MG tablet Take 1 tablet (4 mg total) by mouth every 6 (six) hours as needed for muscle spasms. 30 tablet 0   No facility-administered medications prior to visit.    Allergies  Allergen Reactions   Sulfa Antibiotics Hives    ROS Review of Systems  Constitutional:  Negative for chills and fever.  HENT:  Positive for congestion. Negative for sinus pressure, sinus pain and sore throat.   Eyes:  Negative for pain and discharge.  Respiratory:  Negative for cough and shortness of breath.   Cardiovascular:  Negative for chest pain and palpitations.  Gastrointestinal:  Negative for abdominal pain, constipation, diarrhea, nausea and vomiting.  Endocrine: Negative for polydipsia and polyuria.  Genitourinary:  Negative for dysuria and hematuria.  Musculoskeletal:  Negative for neck pain and neck stiffness.  Skin:  Negative for rash.  Neurological:  Negative for dizziness and weakness.  Psychiatric/Behavioral:  Negative for agitation and behavioral problems.       Objective:    Physical Exam Vitals reviewed.  Constitutional:      General: She is not in acute distress.    Appearance: She is not diaphoretic.  HENT:     Head: Normocephalic and atraumatic.     Nose: Nose normal.     Mouth/Throat:     Mouth: Mucous membranes are moist.  Eyes:     General: No scleral icterus.    Extraocular Movements: Extraocular movements intact.  Cardiovascular:     Rate and Rhythm: Normal rate and regular rhythm.     Heart sounds: Normal heart sounds. No murmur heard. Pulmonary:     Breath sounds: Normal breath sounds. No wheezing or rales.  Abdominal:     Palpations: Abdomen is soft.     Tenderness: There is no abdominal tenderness.  Musculoskeletal:     Cervical  back: Neck supple. No tenderness.     Right lower leg: No edema.     Left lower leg: No edema.  Skin:    General: Skin is warm.     Findings: No rash.  Neurological:     General: No focal deficit present.     Mental Status: She is alert and oriented to person, place, and time.     Cranial Nerves: No cranial nerve deficit.     Sensory: No sensory deficit.     Motor: No weakness.  Psychiatric:        Mood and Affect: Mood normal.        Behavior: Behavior normal.     BP (!) 109/58   Pulse  74   Resp 16   Ht 5' (1.524 m)   Wt 127 lb 6.4 oz (57.8 kg)   SpO2 100%   BMI 24.88 kg/m  Wt Readings from Last 3 Encounters:  03/17/24 127 lb 6.4 oz (57.8 kg)  10/02/23 127 lb 6.4 oz (57.8 kg)  07/21/23 130 lb (59 kg)    No results found for: TSH Lab Results  Component Value Date   WBC 7.6 09/14/2019   HGB 13.4 09/14/2019   HCT 40.3 09/14/2019   MCV 90.6 09/14/2019   PLT 220 09/14/2019   Lab Results  Component Value Date   NA 135 09/14/2019   K 4.0 09/14/2019   CO2 25 09/14/2019   GLUCOSE 100 (H) 09/14/2019   BUN 14 09/14/2019   CREATININE 0.79 09/14/2019   CALCIUM 9.4 09/14/2019   ANIONGAP 11 09/14/2019   No results found for: CHOL No results found for: HDL No results found for: LDLCALC No results found for: TRIG No results found for: CHOLHDL No results found for: YHAJ8R    Assessment & Plan:   Problem List Items Addressed This Visit       Respiratory   Allergic sinusitis   Well-controlled Advised to use Flonase Xyzal  for allergies Advised to use vaporizer for nasal congestion Warm water gargling for sore throat      Relevant Medications   levocetirizine (XYZAL ) 5 MG tablet     Digestive   Gastroesophageal reflux disease without esophagitis   Well-controlled with Pepcid  PRN Avoid hot and spicy food        Other   Encounter for general adult medical examination with abnormal findings - Primary   Physical exam as documented. Fasting  blood tests reviewed. Flu vaccine today. PAP and Mammography at Ob/Gyn.      Mixed hyperlipidemia   Last lipid profile reviewed - LDL 133, stable compared to prior Advised to follow low cholesterol diet for now      Other Visit Diagnoses       Encounter for immunization       Relevant Orders   Flu vaccine trivalent PF, 6mos and older(Flulaval,Afluria,Fluarix,Fluzone) (Completed)       Meds ordered this encounter  Medications   levocetirizine (XYZAL ) 5 MG tablet    Sig: Take 1 tablet (5 mg total) by mouth every evening.    Dispense:  30 tablet    Refill:  3    Follow-up: Return in about 1 year (around 03/17/2025) for Annual physical.    Suzzane MARLA Blanch, MD

## 2024-03-17 NOTE — Assessment & Plan Note (Signed)
 Well-controlled Advised to use Flonase Xyzal  for allergies Advised to use vaporizer for nasal congestion Warm water gargling for sore throat

## 2024-03-17 NOTE — Assessment & Plan Note (Addendum)
 Last lipid profile reviewed - LDL 133, stable compared to prior Advised to follow low cholesterol diet for now

## 2024-03-17 NOTE — Assessment & Plan Note (Signed)
Physical exam as documented. Fasting blood tests reviewed. Flu vaccine today. PAP and Mammography at Ob/Gyn.

## 2024-03-17 NOTE — Patient Instructions (Signed)
 Please start taking Vitamin D  2000 IU once daily.  Please continue to follow heart healthy diet and perform moderate exercise/walking at least 150 mins/week.  Please get fasting blood tests done before the next visit.

## 2024-03-28 ENCOUNTER — Other Ambulatory Visit: Payer: Self-pay | Admitting: Medical Genetics

## 2024-03-28 DIAGNOSIS — Z006 Encounter for examination for normal comparison and control in clinical research program: Secondary | ICD-10-CM

## 2024-05-05 LAB — GENECONNECT MOLECULAR SCREEN: Genetic Analysis Overall Interpretation: NEGATIVE

## 2024-06-12 ENCOUNTER — Other Ambulatory Visit: Payer: Self-pay | Admitting: Internal Medicine

## 2024-06-12 DIAGNOSIS — J309 Allergic rhinitis, unspecified: Secondary | ICD-10-CM

## 2025-03-21 ENCOUNTER — Encounter: Admitting: Internal Medicine
